# Patient Record
Sex: Male | Born: 1950
Health system: Southern US, Community
[De-identification: ages and names within clinical notes are randomized; demographics above are authoritative.]

## PROBLEM LIST (undated history)

## (undated) ENCOUNTER — Emergency Department: Discharge status: Absent without leave | Payer: BLUE CROSS/BLUE SHIELD

## (undated) DIAGNOSIS — E785 Hyperlipidemia, unspecified: Secondary | ICD-10-CM

## (undated) DIAGNOSIS — H269 Unspecified cataract: Secondary | ICD-10-CM

## (undated) DIAGNOSIS — R059 Cough, unspecified: Secondary | ICD-10-CM

## (undated) DIAGNOSIS — R21 Rash and other nonspecific skin eruption: Secondary | ICD-10-CM

## (undated) DIAGNOSIS — R05 Cough: Secondary | ICD-10-CM

## (undated) DIAGNOSIS — I10 Essential (primary) hypertension: Secondary | ICD-10-CM

## (undated) DIAGNOSIS — J329 Chronic sinusitis, unspecified: Secondary | ICD-10-CM

## (undated) DIAGNOSIS — R0981 Nasal congestion: Secondary | ICD-10-CM

## (undated) HISTORY — DX: Hyperlipidemia, unspecified: E78.5

## (undated) HISTORY — DX: Cough: R05

## (undated) HISTORY — DX: Nasal congestion: R09.81

## (undated) HISTORY — DX: Rash and other nonspecific skin eruption: R21

## (undated) HISTORY — DX: Chronic sinusitis, unspecified: J32.9

## (undated) HISTORY — DX: Essential (primary) hypertension: I10

## (undated) HISTORY — DX: Cough, unspecified: R05.9

## (undated) HISTORY — DX: Unspecified cataract: H26.9

---

## 1995-10-28 HISTORY — PX: INGUINAL HERNIA REPAIR: SHX194

## 2001-11-12 ENCOUNTER — Observation Stay (HOSPITAL_COMMUNITY): Admission: EM | Admit: 2001-11-12 | Discharge: 2001-11-13 | Payer: Self-pay | Admitting: *Deleted

## 2001-11-12 ENCOUNTER — Encounter: Payer: Self-pay | Admitting: *Deleted

## 2004-10-27 HISTORY — PX: COLONOSCOPY: SHX174

## 2011-03-25 ENCOUNTER — Encounter: Payer: Self-pay | Admitting: Physician Assistant

## 2013-03-09 ENCOUNTER — Other Ambulatory Visit: Payer: Self-pay | Admitting: Physician Assistant

## 2013-06-12 ENCOUNTER — Other Ambulatory Visit: Payer: Self-pay | Admitting: Family Medicine

## 2013-06-14 ENCOUNTER — Other Ambulatory Visit: Payer: Self-pay | Admitting: Family Medicine

## 2013-06-14 NOTE — Telephone Encounter (Signed)
Last seen 2/14 [facility]   

## 2013-06-14 NOTE — Telephone Encounter (Signed)
Last seen 12/15/12  [facility]

## 2013-06-20 ENCOUNTER — Encounter: Payer: Self-pay | Admitting: Family Medicine

## 2013-06-20 ENCOUNTER — Telehealth: Payer: Self-pay | Admitting: Family Medicine

## 2013-06-20 ENCOUNTER — Ambulatory Visit (INDEPENDENT_AMBULATORY_CARE_PROVIDER_SITE_OTHER): Payer: BC Managed Care – PPO | Admitting: Family Medicine

## 2013-06-20 VITALS — BP 149/89 | HR 76 | Temp 97.6°F | Ht 74.0 in | Wt 265.8 lb

## 2013-06-20 DIAGNOSIS — IMO0002 Reserved for concepts with insufficient information to code with codable children: Secondary | ICD-10-CM

## 2013-06-20 DIAGNOSIS — L02413 Cutaneous abscess of right upper limb: Secondary | ICD-10-CM

## 2013-06-20 MED ORDER — SULFAMETHOXAZOLE-TRIMETHOPRIM 800-160 MG PO TABS: 1.0000 | ORAL_TABLET | Freq: Two times a day (BID) | ORAL | Status: DC

## 2013-06-20 NOTE — Progress Notes (Signed)
Patient ID: James Solis, male   DOB: [birth year], 62 y.o.   MRN: [id number] SUBJECTIVE: CC: Chief Complaint  Patient presents with  . Acute Visit    pustule rt elbow area     HPI: Had a small pimple on the back of the right forearm.distal to the olecranon bursa. The area then quickly got larger and has a pustule wit pus. Thinks it is MRSA No h/o DM. Past Medical History  Diagnosis Date  . Hypertension   . Gout   . Rash     bilateral legs   . Sinusitis   . Nasal congestion   . Cough   . Hyperlipidemia    Past Surgical History  Procedure Laterality Date  . Inguinal hernia repair  1997   History   Social History  . Marital Status: Married    Spouse Name: N/A    Number of Children: N/A  . Years of Education: N/A   Occupational History  . Not on file.   Social History Main Topics  . Smoking status: Never Smoker   . Smokeless tobacco: Not on file  . Alcohol Use: Not on file  . Drug Use: Not on file  . Sexual Activity: Not on file   Other Topics Concern  . Not on file   Social History Narrative  . No narrative on file   Family History  Problem Relation Age of Onset  . Asthma Mother   . Coronary artery disease Father     x3 Bypasses   . Hypertension     Current Outpatient Prescriptions on File Prior to Visit  Medication Sig Dispense Refill  . allopurinol (ZYLOPRIM) 300 MG tablet TAKE ONE TABLET BY MOUTH ONE TIME DAILY  30 tablet  0  . amLODipine (NORVASC) 10 MG tablet TAKE ONE TABLET BY MOUTH ONE TIME DAILY  30 tablet  0  . aspirin (LONGS ADULT LOW STRENGTH ASA) 81 MG EC tablet Take 81 mg by mouth daily.        . clotrimazole-betamethasone (LOTRISONE) cream Apply topically 2 (two) times daily.        Marland Kitchen lisinopril-hydrochlorothiazide (PRINZIDE,ZESTORETIC) 20-25 MG per tablet TAKE ONE TABLET BY MOUTH ONE TIME DAILY  30 tablet  0  . terazosin (HYTRIN) 10 MG capsule Take 10 mg by mouth 2 (two) times daily.         No current facility-administered medications on  file prior to visit.   No Known Allergies  There is no immunization history on file for this patient. Prior to Admission medications   Medication Sig Start Date End Date Taking? Authorizing Provider  allopurinol (ZYLOPRIM) 300 MG tablet TAKE ONE TABLET BY MOUTH ONE TIME DAILY 06/14/13   Ernestina Penna, MD  amLODipine (NORVASC) 10 MG tablet TAKE ONE TABLET BY MOUTH ONE TIME DAILY 06/14/13   Ernestina Penna, MD  aspirin (LONGS ADULT LOW STRENGTH ASA) 81 MG EC tablet Take 81 mg by mouth daily.      Historical Provider, MD  clotrimazole-betamethasone (LOTRISONE) cream Apply topically 2 (two) times daily.      Historical Provider, MD  lisinopril-hydrochlorothiazide (PRINZIDE,ZESTORETIC) 20-25 MG per tablet TAKE ONE TABLET BY MOUTH ONE TIME DAILY 06/12/13   Ernestina Penna, MD  sulfamethoxazole-trimethoprim (BACTRIM DS,SEPTRA DS) 800-160 MG per tablet Take 1 tablet by mouth 2 (two) times daily. 06/20/13   Ileana Ladd, MD  terazosin (HYTRIN) 10 MG capsule Take 10 mg by mouth 2 (two) times daily.      Historical  Provider, MD     ROS: As above in the HPI. All other systems are stable or negative.  OBJECTIVE: APPEARANCE:  Patient in no acute distress.The patient appeared well nourished and normally developed. Acyanotic. Waist: VITAL SIGNS:BP 149/89  Pulse 76  Temp(Src) 97.6 F (36.4 C) (Oral)  Ht 6\' 2"  (1.88 m)  Wt 265 lb 12.8 oz (120.566 kg)  BMI 34.11 kg/m2  WM  SKIN: warm and  Dry . Carbuncle dordal right forearm pointing and easily bursts to give a drop of pus. Diameter is 1.5 inches with a deep fluctuance.  HEAD and Neck: without JVD, Head and scalp: normal Eyes:No scleral icterus. Fundi normal, eye movements normal. Ears: Auricle normal, canal normal, Tympanic membranes normal, insufflation normal. Nose: normal Throat: normal Neck & thyroid: normal  CHEST & LUNGS: Chest wall: normal Lungs: Clear  CVS: Reveals the PMI to be normally located. Regular rhythm, First and Second  Heart sounds are normal,  absence of murmurs, rubs or gallops. Peripheral vasculature: Radial pulses: normal Dorsal pedis pulses: normal Posterior pulses: normal  Incision and Drainage Procedure Note  Pre-operative Diagnosis: abscess right dorsal forearm  Post-operative Diagnosis: same  Indications: Incision and draiage  Anesthesia:ethyl chloride  Procedure Details  The procedure, risks and complications have been discussed in detail (including, but not limited to airway compromise, infection, bleeding) with the patient, and the patient has signed consent to the procedure.  The skin was sterilely prepped over the affected area in the usual fashion. After adequate local anesthesia with ethyl chloride, I&D with a #11 blade was performed on the center of the abscess Purulent drainage:expressed. Loculations broken wound packed with 2 inches of packing The patient was observed until stable. Patient tolerated the procedure well. Dressing and coban applied.  ASSESSMENT: Abscess of arm, right - Plan: sulfamethoxazole-trimethoprim (BACTRIM DS,SEPTRA DS) 800-160 MG per tablet, Aerobic culture   PLAN: Orders Placed This Encounter  Procedures  . Aerobic culture  daily dressing change.  Meds ordered this encounter  Medications  . sulfamethoxazole-trimethoprim (BACTRIM DS,SEPTRA DS) 800-160 MG per tablet    Sig: Take 1 tablet by mouth 2 (two) times daily.    Dispense:  20 tablet    Refill:  0    Return in about 3 days (around 06/23/2013).  Jandi Swiger P. [place]esto Charon, M.D.

## 2013-06-20 NOTE — Telephone Encounter (Signed)
Painful pustule on right elbow x 1 week.  Denies any drainage.   Appt scheduled for this afternoon.  Patient aware.

## 2013-06-22 ENCOUNTER — Encounter: Payer: Self-pay | Admitting: Family Medicine

## 2013-06-22 ENCOUNTER — Ambulatory Visit (INDEPENDENT_AMBULATORY_CARE_PROVIDER_SITE_OTHER): Payer: BC Managed Care – PPO | Admitting: Family Medicine

## 2013-06-22 VITALS — BP 139/88 | HR 74 | Temp 98.5°F | Wt 264.4 lb

## 2013-06-22 DIAGNOSIS — IMO0002 Reserved for concepts with insufficient information to code with codable children: Secondary | ICD-10-CM | POA: Insufficient documentation

## 2013-06-22 LAB — AEROBIC CULTURE

## 2013-06-22 NOTE — Progress Notes (Signed)
Patient ID: Edric Fetterman, male   DOB: 14-Feb-1951, 62 y.o.   MRN: [id number]. SUBJECTIVE: CC: Chief Complaint  Patient presents with  . Acute Visit    remove paciking     HPI: Was sore yesterday but much better today.  Past Medical History  Diagnosis Date  . Hypertension   . Gout   . Rash     bilateral legs   . Sinusitis   . Nasal congestion   . Cough   . Hyperlipidemia    Past Surgical History  Procedure Laterality Date  . Inguinal hernia repair  1997   Current Outpatient Prescriptions on File Prior to Visit  Medication Sig Dispense Refill  . allopurinol (ZYLOPRIM) 300 MG tablet TAKE ONE TABLET BY MOUTH ONE TIME DAILY  30 tablet  0  . amLODipine (NORVASC) 10 MG tablet TAKE ONE TABLET BY MOUTH ONE TIME DAILY  30 tablet  0  . aspirin (LONGS ADULT LOW STRENGTH ASA) 81 MG EC tablet Take 81 mg by mouth daily.        . clotrimazole-betamethasone (LOTRISONE) cream Apply topically 2 (two) times daily.        Marland Kitchen lisinopril-hydrochlorothiazide (PRINZIDE,ZESTORETIC) 20-25 MG per tablet TAKE ONE TABLET BY MOUTH ONE TIME DAILY  30 tablet  0  . sulfamethoxazole-trimethoprim (BACTRIM DS,SEPTRA DS) 800-160 MG per tablet Take 1 tablet by mouth 2 (two) times daily.  20 tablet  0  . terazosin (HYTRIN) 10 MG capsule Take 10 mg by mouth 2 (two) times daily.         No current facility-administered medications on file prior to visit.   No Known Allergies  There is no immunization history on file for this patient. History   Social History  . Marital Status: Married    Spouse Name: N/A    Number of Children: N/A  . Years of Education: N/A   Occupational History  . Not on file.   Social History Main Topics  . Smoking status: Never Smoker   . Smokeless tobacco: Not on file  . Alcohol Use: Not on file  . Drug Use: Not on file  . Sexual Activity: Not on file   Other Topics Concern  . Not on file   Social History Narrative  . No narrative on file      ROS: As above in the  HPI. All other systems are stable or negative.  OBJECTIVE: APPEARANCE:  Patient in no acute distress.The patient appeared well nourished and normally developed. Acyanotic. Waist: VITAL SIGNS:BP 139/88  Pulse 74  Temp(Src) 98.5 F (36.9 C) (Oral)  Wt 264 lb 6.4 oz (119.931 kg)  BMI 33.93 kg/m2 WM  SKIN: warm and  Dry without overt rashes, tattoos and scars EXTREMETIES: nonedematous. Right arm just proximal to the elbow. The wound drained a blob of purulence which was expressed and removed.the packing was shoertened by 1 inch and a pressure dressing reapplied. The surrounding area of induration is sofyer and the cellulitis is resolving.  MUSCULOSKELETAL:  Spine: normal Joints: intact  NEUROLOGIC: oriented to time,place and person; nonfocal.  Results for orders placed in visit on 06/20/13  AEROBIC CULTURE      Result Value Range   Aerobic Bacterial Culture Final report (*)    Result 1 Staphylococcus aureus (*)    ANTIMICROBIAL SUSCEPTIBILITY Comment      ASSESSMENT: Cellulitis and abscess of upper arm and forearm - improved   PLAN: Continue dressing change. Continue bactrim which sensitivities were positive for. RTC in 2  days for packing remove irrigation and determination if repacking is needed.  Kainalu Heggs P. [place] Charon, M.D.

## 2013-06-23 ENCOUNTER — Ambulatory Visit: Payer: BC Managed Care – PPO | Admitting: Family Medicine

## 2013-06-24 ENCOUNTER — Encounter: Payer: Self-pay | Admitting: Family Medicine

## 2013-06-24 ENCOUNTER — Ambulatory Visit (INDEPENDENT_AMBULATORY_CARE_PROVIDER_SITE_OTHER): Payer: BC Managed Care – PPO | Admitting: Family Medicine

## 2013-06-24 DIAGNOSIS — IMO0002 Reserved for concepts with insufficient information to code with codable children: Secondary | ICD-10-CM

## 2013-06-24 NOTE — Progress Notes (Signed)
  Subjective:    Patient ID: James Ben., male    DOB: 1951/01/08, 62 y.o.   MRN: [id number]  HPI  This 62 y.o. male presents for evaluation of right elbow wound.  He underwent An incision and drainage and is here for wound check and to have the wound  repacked.  Review of Systems C/o right elbow wound   No chest pain, SOB, HA, dizziness, vision change, N/V, diarrhea, constipation, dysuria, urinary urgency or frequency, myalgias, arthralgias or rash.  Objective:   Physical Exam Vital signs noted  Well developed well nourished male.  HEENT - Head atraumatic Normocephalic                Eyes - PERRLA, Conjuctiva - clear Sclera- Clear EOMI                Throat - oropharanx wnl Respiratory - Lungs CTA bilateral Cardiac - RRR S1 and S2 without murmur Skin - Right elbow with circular wound and erythematous border.      Assessment & Plan:  Cellulitis and abscess of upper arm and forearm Continue bactrim DS and repack wound and follow up next week for follow up.

## 2013-06-24 NOTE — Patient Instructions (Addendum)
Wound Care Wound care helps prevent pain and infection.  You may need a tetanus shot if:  You cannot remember when you had your last tetanus shot.  You have never had a tetanus shot.  The injury broke your skin. If you need a tetanus shot and you choose not to have one, you may get tetanus. Sickness from tetanus can be serious. HOME CARE   Only take medicine as told by your doctor.  Clean the wound daily with mild soap and water.  Change any bandages (dressings) as told by your doctor.  Put medicated cream and a bandage on the wound as told by your doctor.  Change the bandage if it gets wet, dirty, or starts to smell.  Take showers. Do not take baths, swim, or do anything that puts your wound under water.  Rest and raise (elevate) the wound until the pain and puffiness (swelling) are better.  Keep all doctor visits as told. GET HELP RIGHT AWAY IF:   Yellowish-white fluid (pus) comes from the wound.  Medicine does not lessen your pain.  There is a red streak going away from the wound.  You have a fever. MAKE SURE YOU:   Understand these instructions.  Will watch your condition.  Will get help right away if you are not doing well or get worse. Document Released: 07/22/2008 Document Revised: 01/05/2012 Document Reviewed: 02/16/2011 [employer] Patient Information 2014 [employer].  

## 2013-06-28 ENCOUNTER — Encounter: Payer: Self-pay | Admitting: Family Medicine

## 2013-06-28 ENCOUNTER — Ambulatory Visit (INDEPENDENT_AMBULATORY_CARE_PROVIDER_SITE_OTHER): Payer: BC Managed Care – PPO | Admitting: Family Medicine

## 2013-06-28 VITALS — BP 142/76 | HR 77 | Temp 97.3°F | Wt 265.2 lb

## 2013-06-28 DIAGNOSIS — IMO0002 Reserved for concepts with insufficient information to code with codable children: Secondary | ICD-10-CM

## 2013-06-28 NOTE — Progress Notes (Signed)
Patient ID: James Morici., male   DOB: 12-21-1950, 62 y.o.   MRN: [id number] SUBJECTIVE: CC: Chief Complaint  Patient presents with  . Follow-up    reck rt elbow    HPI: Here for wound check. The packing was removed 4 days ago and he has been cleaning it. It is healing well. Just a few days of antibiotics left.  ROS: As above in the HPI. All other systems are stable or negative.  OBJECTIVE: APPEARANCE:  Patient in no acute distress.The patient appeared well nourished and normally developed. Acyanotic. Waist: VITAL SIGNS:BP 142/76  Pulse 77  Temp(Src) 97.3 F (36.3 C) (Oral)  Wt 265 lb 3.2 oz (120.294 kg)  BMI 34.04 kg/m2 WM  MUSCULOSKELETAL:  Spine: normal Joints: intact  NEUROLOGIC: oriented to time,place and person; nonfocal. Strength is normal Sensory is normal Reflexes are normal Cranial Nerves are normal.  ASSESSMENT: Cellulitis and abscess of upper arm and forearm - resolved with residual granulating area/wound.  PLAN: Continue wound care until totally healed over.  Return if symptoms worsen or fail to improve.  Monique Gift P. [place] Charon, M.D.

## 2013-07-16 ENCOUNTER — Other Ambulatory Visit: Payer: Self-pay | Admitting: Family Medicine

## 2013-07-18 ENCOUNTER — Other Ambulatory Visit: Payer: Self-pay

## 2013-07-18 MED ORDER — TERAZOSIN HCL 10 MG PO CAPS: 10.0000 mg | ORAL_CAPSULE | Freq: Two times a day (BID) | ORAL | Status: DC

## 2013-09-01 ENCOUNTER — Other Ambulatory Visit: Payer: Self-pay

## 2013-09-05 ENCOUNTER — Other Ambulatory Visit: Payer: Self-pay

## 2013-09-05 MED ORDER — CLOTRIMAZOLE-BETAMETHASONE 1-0.05 % EX CREA: TOPICAL_CREAM | Freq: Two times a day (BID) | CUTANEOUS | Status: DC

## 2013-12-15 ENCOUNTER — Encounter: Payer: Self-pay | Admitting: Family Medicine

## 2013-12-15 ENCOUNTER — Ambulatory Visit (INDEPENDENT_AMBULATORY_CARE_PROVIDER_SITE_OTHER): Payer: BC Managed Care – PPO | Admitting: Family Medicine

## 2013-12-15 VITALS — BP 157/90 | HR 63 | Temp 97.1°F | Ht 73.0 in | Wt 264.8 lb

## 2013-12-15 DIAGNOSIS — Z125 Encounter for screening for malignant neoplasm of prostate: Secondary | ICD-10-CM

## 2013-12-15 DIAGNOSIS — I10 Essential (primary) hypertension: Secondary | ICD-10-CM | POA: Insufficient documentation

## 2013-12-15 DIAGNOSIS — Z0000 Encounter for general adult medical examination without abnormal findings: Secondary | ICD-10-CM

## 2013-12-15 DIAGNOSIS — E785 Hyperlipidemia, unspecified: Secondary | ICD-10-CM | POA: Insufficient documentation

## 2013-12-15 DIAGNOSIS — M109 Gout, unspecified: Secondary | ICD-10-CM | POA: Insufficient documentation

## 2013-12-15 DIAGNOSIS — Z119 Encounter for screening for infectious and parasitic diseases, unspecified: Secondary | ICD-10-CM

## 2013-12-15 MED ORDER — TERAZOSIN HCL 10 MG PO CAPS: 10.0000 mg | ORAL_CAPSULE | Freq: Every day | ORAL | Status: DC

## 2013-12-15 MED ORDER — LISINOPRIL 40 MG PO TABS: 40.0000 mg | ORAL_TABLET | Freq: Every day | ORAL | Status: DC

## 2013-12-15 MED ORDER — ALLOPURINOL 300 MG PO TABS: ORAL_TABLET | ORAL | Status: DC

## 2013-12-15 MED ORDER — AMLODIPINE BESYLATE 10 MG PO TABS: ORAL_TABLET | ORAL | Status: DC

## 2013-12-15 NOTE — Progress Notes (Signed)
Patient ID: Aram Domzalski., male   DOB: 1951/03/30, 63 y.o.   MRN: [id number] SUBJECTIVE: CC: Chief Complaint  Patient presents with  . Annual Exam    cpx needs  for anthem inusrance   . Medication Refill    WANTS WRITTEN RX    HPI: 1) here for a annula physical. He did have a colonoscopy and is up to date on that. Documented in the paper chart. Was negative. Next colonoscopy de next year.  2) Patient is here for follow up of hyperlipidemia/HTN/gout/obesity: denies Headache;denies Chest Pain;denies weakness;denies Shortness of Breath and orthopnea;denies Visual changes;denies palpitations;denies cough;denies pedal edema;denies symptoms of TIA or stroke;deniesClaudication symptoms. admits to Compliance with medications; denies Problems with medications.   Past Medical History  Diagnosis Date  . Gout   . Rash     bilateral legs   . Sinusitis   . Nasal congestion   . Cough   . Hyperlipidemia   . Hypertension    Past Surgical History  Procedure Laterality Date  . Inguinal hernia repair  1997   History   Social History  . Marital Status: Married    Spouse Name: N/A    Number of Children: N/A  . Years of Education: N/A   Occupational History  . Not on file.   Social History Main Topics  . Smoking status: Never Smoker   . Smokeless tobacco: Not on file  . Alcohol Use: Not on file  . Drug Use: Not on file  . Sexual Activity: Not on file   Other Topics Concern  . Not on file   Social History Narrative  . No narrative on file   Family History  Problem Relation Age of Onset  . Asthma Mother   . Coronary artery disease Father     x3 Bypasses   . Hypertension     Current Outpatient Prescriptions on File Prior to Visit  Medication Sig Dispense Refill  . aspirin (LONGS ADULT LOW STRENGTH ASA) 81 MG EC tablet Take 81 mg by mouth daily.       . clotrimazole-betamethasone (LOTRISONE) cream Apply topically 2 (two) times daily.  30 g  2   No current  facility-administered medications on file prior to visit.   No Known Allergies Immunization History  Administered Date(s) Administered  . Influenza-Unspecified 12/15/2013  . Tdap 10/27/2004   Prior to Admission medications   Medication Sig Start Date End Date Taking? Authorizing Provider  allopurinol (ZYLOPRIM) 300 MG tablet TAKE ONE TABLET BY MOUTH ONE TIME DAILY 07/16/13  Yes Vernie Shanks, MD  amLODipine (NORVASC) 10 MG tablet TAKE ONE TABLET BY MOUTH ONE  TIME DAILY 07/16/13  Yes Vernie Shanks, MD  aspirin (LONGS ADULT LOW STRENGTH ASA) 81 MG EC tablet Take 81 mg by mouth daily.     Yes Historical Provider, MD  clotrimazole-betamethasone (LOTRISONE) cream Apply topically 2 (two) times daily. 09/05/13  Yes Vernie Shanks, MD  lisinopril-hydrochlorothiazide (PRINZIDE,ZESTORETIC) 20-25 MG per tablet TAKE ONE TABLET BY MOUTH ONE TIME DAILY 07/16/13  Yes Vernie Shanks, MD  terazosin (HYTRIN) 10 MG capsule Take 1 capsule (10 mg total) by mouth 2 (two) times daily. 07/18/13  Yes Chipper Herb, MD     ROS: As above in the HPI. All other systems are stable or negative.  OBJECTIVE: APPEARANCE:  Patient in no acute distress.The patient appeared well nourished and normally developed. Acyanotic. Waist: VITAL SIGNS:BP 157/90  Pulse 63  Temp(Src) 97.1 F (36.2 C) (Oral)  Ht 6' 1"  (1.854 m)  Wt 264 lb 12.8 oz (120.112 kg)  BMI 34.94 kg/m2  Obese WM  SKIN: warm and  Dry without overt rashes, tattoos and scars  HEAD and Neck: without JVD, Head and scalp: normal Eyes:No scleral icterus. Fundi normal, eye movements normal. Ears: Auricle normal, canal normal, Tympanic membranes normal, insufflation normal. Nose: normal Throat: normal Neck & thyroid: normal  CHEST & LUNGS: Chest wall: normal Lungs: Clear  CVS: Reveals the PMI to be normally located. Regular rhythm, First and Second Heart sounds are normal,  absence of murmurs, rubs or gallops. Peripheral vasculature: Radial  pulses: normal Dorsal pedis pulses: normal Posterior pulses: normal  ABDOMEN:  Appearance: Obese Benign, no organomegaly, no masses, no Abdominal Aortic enlargement. No Guarding , no rebound. No Bruits. Bowel sounds: normal  RECTAL: skin tag. Heme negative brown stool. Prostate normal size for age. GU: Normal. No hernia.. No lymphadenopathy  EXTREMETIES: nonedematous.  MUSCULOSKELETAL:  Spine: normal Joints: intact  NEUROLOGIC: oriented to time,place and person; nonfocal. Strength is normal Sensory is normal Reflexes are normal Cranial Nerves are normal.  ASSESSMENT: Physical exam - Plan: EKG 12-Lead, EKG 12-Lead, PSA, total and free, Hepatitis C antibody  Gout - Plan: allopurinol (ZYLOPRIM) 300 MG tablet, CMP14+EGFR, Uric acid  Hyperlipidemia - Plan: CMP14+EGFR, Lipid panel  Hypertension - Plan: terazosin (HYTRIN) 10 MG capsule, amLODipine (NORVASC) 10 MG tablet, lisinopril (PRINIVIL,ZESTRIL) 40 MG tablet, CMP14+EGFR  Screening examination for infectious disease - Plan: Hepatitis C antibody  Screening for prostate cancer - Plan: PSA, total and free  PLAN:       HEALTH MAINTENANCE Immunizations: Tetanus-Diphtheria Booster due:2016 Pertusis Booster due:2016 Flu Shot Due: every Fall Pneumonia Vaccine: usually at 61 years of age unless there are certain risk situations. Herpes Zoster/Shingles Vaccine due: usually at 61 years of age HPV XHB:ZJIR age 85 to 70 years in males and females.  Healthy Life Habits: Exercise Goal: 5-6 days/week; start gradually(ie 30 minutes/3days per week) Nutrition: Balanced healthy meals including Vegetables and Fruits. Consider  Reading the following books: 1) Eat to Live by Dr Diona Browner; 2) Prevent and Reverse Heart Disease by Dr Karl Luke.  Vitamins:a multivitamin is okay if taken Aspirin:81 mg daily Stop Tobacco Use:n/a Seat Belt Use:+++ recommended Sunscreen Use:+++ recommended  Recommended Screening Tests: Colon  Cancer Screening: due 2016 Blood work: today Cholesterol Screening:    today       HIV:   n/a                 Hepatitis C(people born 1945-1965):today Monthly Self Testicular Exam:+++  Eye Exam: every 1 to 2 years recommended Dental Health: at least every 6 months  Others:    Living Will/Healthcare Power of Attorney: should have this in order with your personal estate planning     Orders Placed This Encounter  Procedures  . CMP14+EGFR  . Lipid panel  . Uric acid  . PSA, total and free  . Hepatitis C antibody  . EKG 12-Lead    Standing Status: Standing     Number of Occurrences: 1     Standing Expiration Date:     Order Specific Question:  Reason for Exam    Answer:  PHYSICAL   EKG : was normal. Will eliminate the diuretic to reduce the hyperuricemia. Adjust BP medications to try to attain goal. Nutrition recommendations.  Meds ordered this encounter  Medications  . terazosin (HYTRIN) 10 MG capsule    Sig: Take 1 capsule (10 mg  total) by mouth at bedtime.    Dispense:  90 capsule    Refill:  3  . amLODipine (NORVASC) 10 MG tablet    Sig: TAKE ONE TABLET BY MOUTH ONE  TIME DAILY    Dispense:  90 tablet    Refill:  3  . allopurinol (ZYLOPRIM) 300 MG tablet    Sig: TAKE ONE TABLET BY MOUTH ONE TIME DAILY    Dispense:  90 tablet    Refill:  3  . lisinopril (PRINIVIL,ZESTRIL) 40 MG tablet    Sig: Take 1 tablet (40 mg total) by mouth daily.    Dispense:  90 tablet    Refill:  3   Medications Discontinued During This Encounter  Medication Reason  . sulfamethoxazole-trimethoprim (BACTRIM DS,SEPTRA DS) 800-160 MG per tablet Completed Course  . lisinopril-hydrochlorothiazide (PRINZIDE,ZESTORETIC) 20-25 MG per tablet Dose change  . terazosin (HYTRIN) 10 MG capsule Reorder  . amLODipine (NORVASC) 10 MG tablet Reorder  . allopurinol (ZYLOPRIM) 300 MG tablet Reorder   Return in about 6 weeks (around 01/26/2014) for recheck BP.  Darshan Solanki P. Jacelyn Grip, M.D.

## 2013-12-15 NOTE — Patient Instructions (Addendum)
HEALTH MAINTENANCE Immunizations: Tetanus-Diphtheria Booster due:2016 Pertusis Booster due:2016 Flu Shot Due: every Fall Pneumonia Vaccine: usually at 61 years of age unless there are certain risk situations. Herpes Zoster/Shingles Vaccine due: usually at 61 years of age HPV [id number]:from age 299 to 6326 years in males and females.  Healthy Life Habits: Exercise Goal: 5-6 days/week; start gradually(ie 30 minutes/3days per week) Nutrition: Balanced healthy meals including Vegetables and Fruits. Consider  Reading the following books: 1) Eat to Live by Dr Ottis StainJoel Furhman; 2) Prevent and Reverse Heart Disease by Dr Suzzette Righteraldwell Esselstyn.  Vitamins:a multivitamin is okay if taken Aspirin:81 mg daily Stop Tobacco Use:n/a Seat Belt Use:+++ recommended Sunscreen Use:+++ recommended  Recommended Screening Tests: Colon Cancer Screening: due 2016 Blood work: today Cholesterol Screening:    today       HIV:   n/a                 Hepatitis C(people born 1945-1965):today Monthly Self Testicular Exam:+++  Eye Exam: every 1 to 2 years recommended Dental Health: at least every 6 months  Others:    Living Will/Healthcare Power of Attorney: should have this in order with your personal estate planning

## 2013-12-16 LAB — LIPID PANEL
Chol/HDL Ratio: 4.4 ratio units (ref 0.0–5.0)
Cholesterol, Total: 174 mg/dL (ref 100–199)
HDL: 40 mg/dL (ref >39)
LDL Calculated: 107 mg/dL — ABNORMAL HIGH (ref 0–99)
Triglycerides: 137 mg/dL (ref 0–149)
VLDL Cholesterol Cal: 27 mg/dL (ref 5–40)

## 2013-12-16 LAB — PSA, TOTAL AND FREE
PSA, Free Pct: 48.8 %
PSA, Free: 0.78 ng/mL
PSA: 1.6 ng/mL (ref 0.0–4.0)

## 2013-12-16 LAB — CMP14+EGFR
ALT: 30 IU/L (ref 0–44)
AST: 21 IU/L (ref 0–40)
Albumin/Globulin Ratio: 2.1 (ref 1.1–2.5)
Albumin: 4.7 g/dL (ref 3.6–4.8)
Alkaline Phosphatase: 56 IU/L (ref 39–117)
BUN/Creatinine Ratio: 23 — ABNORMAL HIGH (ref 10–22)
BUN: 23 mg/dL (ref 8–27)
CO2: 27 mmol/L (ref 18–29)
Calcium: 9.8 mg/dL (ref 8.6–10.2)
Chloride: 102 mmol/L (ref 97–108)
Creatinine, Ser: 1 mg/dL (ref 0.76–1.27)
GFR calc Af Amer: 94 mL/min/{1.73_m2} (ref >59)
GFR calc non Af Amer: 81 mL/min/{1.73_m2} (ref >59)
Globulin, Total: 2.2 g/dL (ref 1.5–4.5)
Glucose: 93 mg/dL (ref 65–99)
Potassium: 3.7 mmol/L (ref 3.5–5.2)
Sodium: 143 mmol/L (ref 134–144)
Total Bilirubin: 0.6 mg/dL (ref 0.0–1.2)
Total Protein: 6.9 g/dL (ref 6.0–8.5)

## 2013-12-16 LAB — HEPATITIS C ANTIBODY: Hep C Virus Ab: <0.1 s/co ratio (ref 0.0–0.9)

## 2013-12-16 LAB — URIC ACID: Uric Acid: 5.5 mg/dL (ref 3.7–8.6)

## 2013-12-30 ENCOUNTER — Encounter: Payer: Self-pay | Admitting: Family Medicine

## 2013-12-30 ENCOUNTER — Ambulatory Visit (INDEPENDENT_AMBULATORY_CARE_PROVIDER_SITE_OTHER): Payer: BC Managed Care – PPO | Admitting: Family Medicine

## 2013-12-30 VITALS — BP 168/91 | HR 76 | Temp 101.0°F | Ht 73.0 in | Wt 264.0 lb

## 2013-12-30 DIAGNOSIS — R05 Cough: Secondary | ICD-10-CM

## 2013-12-30 DIAGNOSIS — R059 Cough, unspecified: Secondary | ICD-10-CM

## 2013-12-30 DIAGNOSIS — R52 Pain, unspecified: Secondary | ICD-10-CM

## 2013-12-30 DIAGNOSIS — J111 Influenza due to unidentified influenza virus with other respiratory manifestations: Secondary | ICD-10-CM

## 2013-12-30 DIAGNOSIS — R509 Fever, unspecified: Secondary | ICD-10-CM

## 2013-12-30 LAB — POCT INFLUENZA A/B
Influenza A, POC: POSITIVE
Influenza B, POC: NEGATIVE

## 2013-12-30 MED ORDER — OSELTAMIVIR PHOSPHATE 75 MG PO CAPS: 75.0000 mg | ORAL_CAPSULE | Freq: Two times a day (BID) | ORAL | Status: DC

## 2013-12-30 MED ORDER — AZITHROMYCIN 250 MG PO TABS: ORAL_TABLET | ORAL | Status: DC

## 2013-12-30 NOTE — Progress Notes (Signed)
   Subjective:    Patient ID: James BenAlvin Gammell Jr., male    DOB: 06/18/1951, 63 y.o.   MRN: [id number]  HPI  This 63 y.o. male presents for evaluation of aches, pain, fever, URI sx's for the last day.  Review of Systems    No chest pain, SOB, HA, dizziness, vision change, N/V, diarrhea, constipation, dysuria, urinary urgency or frequency, myalgias, arthralgias or rash.  Objective:   Physical Exam Vital signs noted  Well developed well nourished male.  HEENT - Head atraumatic Normocephalic                Eyes - PERRLA, Conjuctiva - clear Sclera- Clear EOMI                Ears - EAC's Wnl TM's Wnl Gross Hearing WNL                Nose - Nares patent                 Throat - oropharanx wnl Respiratory - Lungs CTA bilateral Cardiac - RRR S1 and S2 without murmur GI - Abdomen soft Nontender and bowel sounds active x 4 Extremities - No edema. Neuro - Grossly intact.       Assessment & Plan:  Fever - Plan: POCT Influenza A/B  Body aches - Plan: POCT Influenza A/B  Cough - Plan: POCT Influenza A/B  Flu - Plan: azithromycin (ZITHROMAX) 250 MG tablet, oseltamivir (TAMIFLU) 75 MG capsule  Push po fluids, rest, tylenol and motrin otc prn as directed for fever, arthralgias, and myalgias.  Follow up prn if sx's continue or persist.  Deatra CanterWilliam J Shaneisha Burkel FNP

## 2014-01-26 ENCOUNTER — Ambulatory Visit (INDEPENDENT_AMBULATORY_CARE_PROVIDER_SITE_OTHER): Payer: BC Managed Care – PPO | Admitting: Family Medicine

## 2014-01-26 ENCOUNTER — Encounter: Payer: Self-pay | Admitting: Family Medicine

## 2014-01-26 VITALS — BP 142/85 | HR 62 | Temp 97.9°F | Ht 73.0 in | Wt 269.0 lb

## 2014-01-26 DIAGNOSIS — Z23 Encounter for immunization: Secondary | ICD-10-CM | POA: Insufficient documentation

## 2014-01-26 DIAGNOSIS — E785 Hyperlipidemia, unspecified: Secondary | ICD-10-CM

## 2014-01-26 DIAGNOSIS — I10 Essential (primary) hypertension: Secondary | ICD-10-CM

## 2014-01-26 DIAGNOSIS — M109 Gout, unspecified: Secondary | ICD-10-CM

## 2014-01-26 NOTE — Progress Notes (Signed)
Patient ID: James Kiener., male   DOB: 1951-09-28, 63 y.o.   MRN: [id number] SUBJECTIVE: CC: Chief Complaint  Patient presents with  . Hypertension    recheck    HPI: Patient is here for follow up of hypertension: denies Headache;deniesChest Pain;denies weakness;denies Shortness of Breath or Orthopnea;denies Visual changes;denies palpitations;denies cough;denies pedal edema;denies symptoms of TIA or stroke; admits to Compliance with medications. denies Problems with medications.  Past Medical History  Diagnosis Date  . Gout   . Rash     bilateral legs   . Sinusitis   . Nasal congestion   . Cough   . Hyperlipidemia   . Hypertension    Past Surgical History  Procedure Laterality Date  . Inguinal hernia repair  1997   History   Social History  . Marital Status: Married    Spouse Name: N/A    Number of Children: N/A  . Years of Education: N/A   Occupational History  . Not on file.   Social History Main Topics  . Smoking status: Never Smoker   . Smokeless tobacco: Not on file  . Alcohol Use: Not on file  . Drug Use: Not on file  . Sexual Activity: Not on file   Other Topics Concern  . Not on file   Social History Narrative  . No narrative on file   Family History  Problem Relation Age of Onset  . Asthma Mother   . Coronary artery disease Father     x3 Bypasses   . Hypertension     Current Outpatient Prescriptions on File Prior to Visit  Medication Sig Dispense Refill  . allopurinol (ZYLOPRIM) 300 MG tablet TAKE ONE TABLET BY MOUTH ONE TIME DAILY  90 tablet  3  . amLODipine (NORVASC) 10 MG tablet TAKE ONE TABLET BY MOUTH ONE  TIME DAILY  90 tablet  3  . aspirin (LONGS ADULT LOW STRENGTH ASA) 81 MG EC tablet Take 81 mg by mouth daily.       Marland Kitchen lisinopril (PRINIVIL,ZESTRIL) 40 MG tablet Take 1 tablet (40 mg total) by mouth daily.  90 tablet  3  . terazosin (HYTRIN) 10 MG capsule Take 1 capsule (10 mg total) by mouth at bedtime.  90 capsule  3   No current  facility-administered medications on file prior to visit.   No Known Allergies Immunization History  Administered Date(s) Administered  . Influenza-Unspecified 12/15/2013  . Tdap 10/27/2004  . Zoster 01/26/2014   Prior to Admission medications   Medication Sig Start Date End Date Taking? Authorizing Provider  allopurinol (ZYLOPRIM) 300 MG tablet TAKE ONE TABLET BY MOUTH ONE TIME DAILY 12/15/13  Yes Ileana Ladd, MD  amLODipine (NORVASC) 10 MG tablet TAKE ONE TABLET BY MOUTH ONE  TIME DAILY 12/15/13  Yes Ileana Ladd, MD  aspirin (LONGS ADULT LOW STRENGTH ASA) 81 MG EC tablet Take 81 mg by mouth daily.    Yes Historical Provider, MD  lisinopril (PRINIVIL,ZESTRIL) 40 MG tablet Take 1 tablet (40 mg total) by mouth daily. 12/15/13  Yes Ileana Ladd, MD  terazosin (HYTRIN) 10 MG capsule Take 1 capsule (10 mg total) by mouth at bedtime. 12/15/13  Yes Ileana Ladd, MD     ROS: As above in the HPI. All other systems are stable or negative.  OBJECTIVE: APPEARANCE:  Patient in no acute distress.The patient appeared well nourished and normally developed. Acyanotic. Waist: VITAL SIGNS:BP 142/85  Pulse 62  Temp(Src) 97.9 F (36.6 C) (Oral)  Ht 6\' 1"  (1.854 m)  Wt 269 lb (122.018 kg)  BMI 35.50 kg/m2 Obese WM  SKIN: warm and  Dry without overt rashes, tattoos and scars  HEAD and Neck: without JVD, Head and scalp: normal Eyes:No scleral icterus. Fundi normal, eye movements normal. Ears: Auricle normal, canal normal, Tympanic membranes normal, insufflation normal. Nose: normal Throat: normal Neck & thyroid: normal  CHEST & LUNGS: Chest wall: normal Lungs: Clear  CVS: Reveals the PMI to be normally located. Regular rhythm, First and Second Heart sounds are normal,  absence of murmurs, rubs or gallops. Peripheral vasculature: Radial pulses: normal Dorsal pedis pulses: normal Posterior pulses: normal  ABDOMEN:  Appearance: Obese Benign, no organomegaly, no masses, no  Abdominal Aortic enlargement. No Guarding , no rebound. No Bruits. Bowel sounds: normal  RECTAL: N/A GU: N/A  EXTREMETIES: nonedematous.  MUSCULOSKELETAL:  Spine: normal Joints: intact  NEUROLOGIC: oriented to time,place and person; nonfocal. Strength is normal Sensory is normal Reflexes are normal Cranial Nerves are normal.   ASSESSMENT:  Hypertension  Need for zoster vaccine - Plan: Varicella-zoster vaccine subcutaneous  Hyperlipidemia  Gout BP is better with the change in therapy. The HCTZ was removed from the regimen to avoid worsening gout frequencies. And the ACE inhibitor was increased   PLAN:      Dr Woodroe ModeFrancis Alyana Kreiter's Recommendations  For nutrition information, I recommend books:  1).Eat to Live by Dr Monico HoarJoel Fuhrman. 2).Prevent and Reverse Heart Disease by Dr Suzzette Righteraldwell Esselstyn. 3) Dr Katherina RightNeal Barnard's Book:  Program to Reverse Diabetes  Exercise recommendations are:  If unable to walk, then the patient can exercise in a chair 3 times a day. By flapping arms like a bird gently and raising legs outwards to the front.  If ambulatory, the patient can go for walks for 30 minutes 3 times a week. Then increase the intensity and duration as tolerated.  Goal is to try to attain exercise frequency to 5 times a week.  If applicable: Best to perform resistance exercises (machines or weights) 2 days a week and cardio type exercises 3 days per week.   Patient declined any medication increases or adjustments to reach to goal. He plans to lose weight dietary changes and exercise.  Orders Placed This Encounter  Procedures  . Varicella-zoster vaccine subcutaneous   No orders of the defined types were placed in this encounter.   Medications Discontinued During This Encounter  Medication Reason  . azithromycin (ZITHROMAX) 250 MG tablet Error  . clotrimazole-betamethasone (LOTRISONE) cream   . lisinopril-hydrochlorothiazide (PRINZIDE,ZESTORETIC) 20-25 MG per tablet Change  in therapy  . oseltamivir (TAMIFLU) 75 MG capsule    Return in about 3 months (around 04/27/2014) for recheck BP, Recheck medical problems.  Lilyona Richner P. Modesto CharonWong, M.D.

## 2014-01-26 NOTE — Patient Instructions (Signed)
      Dr Jolicia Delira's Recommendations  For nutrition information, I recommend books:  1).Eat to Live by Dr Joel Fuhrman. 2).Prevent and Reverse Heart Disease by Dr Caldwell Esselstyn. 3) Dr Neal Barnard's Book:  Program to Reverse Diabetes  Exercise recommendations are:  If unable to walk, then the patient can exercise in a chair 3 times a day. By flapping arms like a bird gently and raising legs outwards to the front.  If ambulatory, the patient can go for walks for 30 minutes 3 times a week. Then increase the intensity and duration as tolerated.  Goal is to try to attain exercise frequency to 5 times a week.  If applicable: Best to perform resistance exercises (machines or weights) 2 days a week and cardio type exercises 3 days per week.  

## 2014-08-28 ENCOUNTER — Encounter: Payer: Self-pay | Admitting: Family Medicine

## 2014-08-28 ENCOUNTER — Ambulatory Visit (INDEPENDENT_AMBULATORY_CARE_PROVIDER_SITE_OTHER): Payer: BC Managed Care – PPO | Admitting: Family Medicine

## 2014-08-28 VITALS — BP 142/88 | HR 83 | Temp 97.2°F | Ht 73.0 in | Wt 267.6 lb

## 2014-08-28 DIAGNOSIS — J069 Acute upper respiratory infection, unspecified: Secondary | ICD-10-CM

## 2014-08-28 MED ORDER — AZITHROMYCIN 250 MG PO TABS: ORAL_TABLET | ORAL | Status: DC

## 2014-08-28 MED ORDER — BENZONATATE 100 MG PO CAPS: 100.0000 mg | ORAL_CAPSULE | Freq: Three times a day (TID) | ORAL | Status: DC | PRN

## 2014-08-28 NOTE — Progress Notes (Signed)
   Subjective:    Patient ID: James BenAlvin Jian Jr., male    DOB: [birth year], 63 y.o.   MRN: [id number]  HPI C/o uri sx's and cough  Review of Systems  Constitutional: Negative for fever.  HENT: Negative for ear pain.   Eyes: Negative for discharge.  Respiratory: Negative for cough.   Cardiovascular: Negative for chest pain.  Gastrointestinal: Negative for abdominal distention.  Endocrine: Negative for polyuria.  Genitourinary: Negative for difficulty urinating.  Musculoskeletal: Negative for gait problem and neck pain.  Skin: Negative for color change and rash.  Neurological: Negative for speech difficulty and headaches.  Psychiatric/Behavioral: Negative for agitation.       Objective:    BP 142/88 mmHg  Pulse 83  Temp(Src) 97.2 F (36.2 C) (Oral)  Ht 6\' 1"  (1.854 m)  Wt 267 lb 9.6 oz (121.383 kg)  BMI 35.31 kg/m2 Physical Exam  Constitutional: He is oriented to person, place, and time. He appears well-developed and well-nourished.  HENT:  Head: Normocephalic and atraumatic.  Mouth/Throat: Oropharynx is clear and moist.  Eyes: Pupils are equal, round, and reactive to light.  Neck: Normal range of motion. Neck supple.  Cardiovascular: Normal rate and regular rhythm.   No murmur heard. Pulmonary/Chest: Effort normal and breath sounds normal.  Abdominal: Soft. Bowel sounds are normal. There is no tenderness.  Neurological: He is alert and oriented to person, place, and time.  Skin: Skin is warm and dry.  Psychiatric: He has a normal mood and affect.          Assessment & Plan:     ICD-9-CM ICD-10-CM   1. URI (upper respiratory infection) 465.9 J06.9 azithromycin (ZITHROMAX) 250 MG tablet     benzonatate (TESSALON PERLES) 100 MG capsule  Push po fluids, rest, tylenol and motrin otc prn as directed for fever, arthralgias, and myalgias.  Follow up prn if sx's continue or persist.   Return if symptoms worsen or fail to improve.  Deatra CanterWilliam J Chirstopher Iovino FNP

## 2014-09-19 ENCOUNTER — Telehealth: Payer: Self-pay | Admitting: Family Medicine

## 2014-09-19 DIAGNOSIS — J069 Acute upper respiratory infection, unspecified: Secondary | ICD-10-CM

## 2014-09-19 MED ORDER — AZITHROMYCIN 250 MG PO TABS: ORAL_TABLET | ORAL | Status: DC

## 2014-09-19 NOTE — Telephone Encounter (Signed)
Family aware, antibiotic refill sent in.

## 2014-09-29 ENCOUNTER — Ambulatory Visit (INDEPENDENT_AMBULATORY_CARE_PROVIDER_SITE_OTHER): Payer: BC Managed Care – PPO | Admitting: Physician Assistant

## 2014-09-29 ENCOUNTER — Encounter: Payer: Self-pay | Admitting: Physician Assistant

## 2014-09-29 VITALS — BP 161/86 | HR 71 | Temp 96.8°F | Ht 73.0 in | Wt 272.0 lb

## 2014-09-29 DIAGNOSIS — K219 Gastro-esophageal reflux disease without esophagitis: Secondary | ICD-10-CM

## 2014-09-29 DIAGNOSIS — R05 Cough: Secondary | ICD-10-CM

## 2014-09-29 DIAGNOSIS — R059 Cough, unspecified: Secondary | ICD-10-CM

## 2014-09-29 MED ORDER — OMEPRAZOLE 20 MG PO CPDR: 20.0000 mg | DELAYED_RELEASE_CAPSULE | Freq: Every day | ORAL | Status: DC

## 2014-09-29 MED ORDER — FLUTICASONE PROPIONATE 50 MCG/ACT NA SUSP: 2.0000 | Freq: Every day | NASAL | Status: DC

## 2014-09-29 MED ORDER — CETIRIZINE HCL 10 MG PO TABS: 10.0000 mg | ORAL_TABLET | Freq: Every day | ORAL | Status: DC

## 2014-09-29 NOTE — Progress Notes (Signed)
   Subjective:    Patient ID: James BenAlvin Baldree Jr., male    DOB: 1951-03-04, 63 y.o.   MRN: [id number]  HPI  63 y/o overweight male presents with chronic non productive cough x 3 months. Worse whenn lying down. Has been treated with antibiotics twice with no relief. Associated "trickle in his throat" occasional sneezing and runny nose. Admits that he does not eat healthy, mainly spicy and fatty foods.   Review of Systems  Constitutional: Negative.   HENT: Positive for postnasal drip. Negative for congestion.   Respiratory: Positive for cough (nonproductive). Negative for apnea, choking, chest tightness, shortness of breath, wheezing and stridor.   Cardiovascular: Negative.   Gastrointestinal: Negative.        Objective:   Physical Exam  Constitutional: He appears well-developed and well-nourished. No distress.  HENT:  Head: Normocephalic.  Mouth/Throat: Oropharynx is clear and moist. No oropharyngeal exudate.  Cardiovascular: Normal rate, regular rhythm and normal heart sounds.  Exam reveals no gallop and no friction rub.   No murmur heard. Pulmonary/Chest: Effort normal and breath sounds normal. No respiratory distress. He has no wheezes. He has no rales.  Abdominal: Soft. He exhibits no distension. There is no tenderness.  Skin: He is not diaphoretic.  Vitals reviewed.         Assessment & Plan:  1 cough: No bacterial cause suspected. I feel that this is a combination of allergic rhinitis and GERD so I will treat for both to determine if cough subsides. Printouts were given for each and triggers of GERD were discussed with patient. He will f/u in 3-4 weeks for reassessment. If no improvement with medications, we will look into the possibility of an ACE inhibitor related cough.  Rx: Zyrtec 10 mg PO q day, Flonase as directed.  Omeprazole 20mg  q day

## 2014-09-29 NOTE — Patient Instructions (Signed)
Allergic Rhinitis Allergic rhinitis is when the mucous membranes in the nose respond to allergens. Allergens are particles in the air that cause your body to have an allergic reaction. This causes you to release allergic antibodies. Through a chain of events, these eventually cause you to release histamine into the blood stream. Although meant to protect the body, it is this release of histamine that causes your discomfort, such as frequent sneezing, congestion, and an itchy, runny nose.  CAUSES  Seasonal allergic rhinitis (hay fever) is caused by pollen allergens that may come from grasses, trees, and weeds. Year-round allergic rhinitis (perennial allergic rhinitis) is caused by allergens such as house dust mites, pet dander, and mold spores.  SYMPTOMS   Nasal stuffiness (congestion).  Itchy, runny nose with sneezing and tearing of the eyes. DIAGNOSIS  Your health care provider can help you determine the allergen or allergens that trigger your symptoms. If you and your health care provider are unable to determine the allergen, skin or blood testing may be used. TREATMENT  Allergic rhinitis does not have a cure, but it can be controlled by:  Medicines and allergy shots (immunotherapy).  Avoiding the allergen. Hay fever may often be treated with antihistamines in pill or nasal spray forms. Antihistamines block the effects of histamine. There are over-the-counter medicines that may help with nasal congestion and swelling around the eyes. Check with your health care provider before taking or giving this medicine.  If avoiding the allergen or the medicine prescribed do not work, there are many new medicines your health care provider can prescribe. Stronger medicine may be used if initial measures are ineffective. Desensitizing injections can be used if medicine and avoidance does not work. Desensitization is when a patient is given ongoing shots until the body becomes less sensitive to the allergen.  Make sure you follow up with your health care provider if problems continue. HOME CARE INSTRUCTIONS It is not possible to completely avoid allergens, but you can reduce your symptoms by taking steps to limit your exposure to them. It helps to know exactly what you are allergic to so that you can avoid your specific triggers. SEEK MEDICAL CARE IF:   You have a fever.  You develop a cough that does not stop easily (persistent).  You have shortness of breath.  You start wheezing.  Symptoms interfere with normal daily activities. Document Released: 07/08/2001 Document Revised: 10/18/2013 Document Reviewed: 06/20/2013 [facility] Patient Information 2015 [place], [place]. This information is not intended to replace advice given to you by your health care provider. Make sure you discuss any questions you have with your health care provider. Gastroesophageal Reflux Disease, Adult Gastroesophageal reflux disease (GERD) happens when acid from your stomach goes into your food pipe (esophagus). The acid can cause a burning feeling in your chest. Over time, the acid can make small holes (ulcers) in your food pipe.  HOME CARE  Ask your doctor for advice about:  Losing weight.  Quitting smoking.  Alcohol use.  Avoid foods and drinks that make your problems worse. You may want to avoid:  Caffeine and alcohol.  Chocolate.  Mints.  Garlic and onions.  Spicy foods.  Citrus fruits, such as oranges, lemons, or limes.  Foods that contain tomato, such as sauce, chili, salsa, and pizza.  Fried and fatty foods.  Avoid lying down for 3 hours before you go to bed or before you take a nap.  Eat small meals often, instead of large meals.  Wear loose-fitting clothing.  Do not wear anything tight around your waist.  Raise (elevate) the head of your bed 6 to 8 inches with wood blocks. Using extra pillows does not help.  Only take medicines as told by your doctor.  Do not take aspirin or  ibuprofen. GET HELP RIGHT AWAY IF:   You have pain in your arms, neck, jaw, teeth, or back.  Your pain gets worse or changes.  You feel sick to your stomach (nauseous), throw up (vomit), or sweat (diaphoresis).  You feel short of breath, or you pass out (faint).  Your throw up is green, yellow, black, or looks like coffee grounds or blood.  Your poop (stool) is red, bloody, or black. MAKE SURE YOU:   Understand these instructions.  Will watch your condition.  Will get help right away if you are not doing well or get worse. Document Released: 03/31/2008 Document Revised: 01/05/2012 Document Reviewed: 05/02/2011 [facility] Patient Information 2015 [place], [place]. This information is not intended to replace advice given to you by your health care provider. Make sure you discuss any questions you have with your health care provider.

## 2014-10-24 ENCOUNTER — Ambulatory Visit: Payer: BC Managed Care – PPO | Admitting: Family Medicine

## 2014-12-25 ENCOUNTER — Other Ambulatory Visit: Payer: Self-pay

## 2014-12-25 MED ORDER — ALLOPURINOL 300 MG PO TABS: ORAL_TABLET | ORAL | Status: DC

## 2014-12-25 MED ORDER — LISINOPRIL 40 MG PO TABS: 40.0000 mg | ORAL_TABLET | Freq: Every day | ORAL | Status: DC

## 2014-12-25 MED ORDER — AMLODIPINE BESYLATE 10 MG PO TABS: ORAL_TABLET | ORAL | Status: DC

## 2014-12-25 MED ORDER — TERAZOSIN HCL 10 MG PO CAPS: 10.0000 mg | ORAL_CAPSULE | Freq: Every day | ORAL | Status: DC

## 2014-12-25 NOTE — Telephone Encounter (Signed)
z

## 2015-03-23 ENCOUNTER — Other Ambulatory Visit: Payer: Self-pay | Admitting: Physician Assistant

## 2015-03-23 NOTE — Telephone Encounter (Signed)
I have not saw this patient for these medications. He hasn't had labs since Feb 15. Ntbs & have  labs for refills   Tiffany A. Chauncey ReadingGann PA-C

## 2015-03-23 NOTE — Telephone Encounter (Signed)
Last seen 09/29/14 James Solis  Requesting 90 day supply

## 2015-03-29 ENCOUNTER — Encounter: Payer: Self-pay | Admitting: Family Medicine

## 2015-03-29 ENCOUNTER — Encounter: Payer: Self-pay | Admitting: Gastroenterology

## 2015-03-29 ENCOUNTER — Ambulatory Visit (INDEPENDENT_AMBULATORY_CARE_PROVIDER_SITE_OTHER): Payer: BLUE CROSS/BLUE SHIELD | Admitting: Family Medicine

## 2015-03-29 VITALS — BP 149/92 | HR 60 | Temp 97.4°F | Ht 73.0 in | Wt 266.0 lb

## 2015-03-29 DIAGNOSIS — Z0000 Encounter for general adult medical examination without abnormal findings: Secondary | ICD-10-CM

## 2015-03-29 DIAGNOSIS — Z1211 Encounter for screening for malignant neoplasm of colon: Secondary | ICD-10-CM | POA: Diagnosis not present

## 2015-03-29 MED ORDER — METOPROLOL SUCCINATE ER 50 MG PO TB24: 50.0000 mg | ORAL_TABLET | Freq: Every day | ORAL | Status: DC

## 2015-03-29 MED ORDER — AMLODIPINE BESYLATE 10 MG PO TABS: 10.0000 mg | ORAL_TABLET | Freq: Every day | ORAL | Status: DC

## 2015-03-29 MED ORDER — TERAZOSIN HCL 10 MG PO CAPS: 10.0000 mg | ORAL_CAPSULE | Freq: Every day | ORAL | Status: DC

## 2015-03-29 MED ORDER — FLUOCINONIDE 0.05 % EX OINT: TOPICAL_OINTMENT | Freq: Two times a day (BID) | CUTANEOUS | Status: DC

## 2015-03-29 MED ORDER — LISINOPRIL 40 MG PO TABS: 40.0000 mg | ORAL_TABLET | Freq: Every day | ORAL | Status: DC

## 2015-03-29 MED ORDER — ALLOPURINOL 300 MG PO TABS: ORAL_TABLET | ORAL | Status: DC

## 2015-03-29 NOTE — Progress Notes (Signed)
Subjective:    Patient ID: Jolyn Nap., male    DOB: [birth year], 64 y.o.   MRN: [id number]  HPI 64 year old man who is here for annual physical. His only problem is hypertension and that is significant because he is on optimal doses of 3 different classes of blood pressure pills and pressure is still elevated. He has no other complaints. He requests that we monitor lipids and a PSA so will do some blood work today  Patient Active Problem List   Diagnosis Date Noted  . Need for zoster vaccine 01/26/2014  . Hyperlipidemia   . Hypertension   . Gout   . Cellulitis and abscess of upper arm and forearm 06/22/2013   Outpatient Encounter Prescriptions as of 03/29/2015  Medication Sig  . allopurinol (ZYLOPRIM) 300 MG tablet TAKE ONE TABLET BY MOUTH ONE TIME DAILY  . amLODipine (NORVASC) 10 MG tablet TAKE 1 TABLET ONCE DAILY  . aspirin (LONGS ADULT LOW STRENGTH ASA) 81 MG EC tablet Take 81 mg by mouth daily.   . fluocinonide ointment (LIDEX) 0.05 %   . lisinopril (PRINIVIL,ZESTRIL) 40 MG tablet Take 1 tablet (40 mg total) by mouth daily.  Marland Kitchen terazosin (HYTRIN) 10 MG capsule TAKE 1 CAPSULE AT BEDTIME  . [DISCONTINUED] omeprazole (PRILOSEC) 20 MG capsule Take 1 capsule (20 mg total) by mouth daily.  . [DISCONTINUED] cetirizine (ZYRTEC) 10 MG tablet Take 1 tablet (10 mg total) by mouth daily.  . [DISCONTINUED] fluticasone (FLONASE) 50 MCG/ACT nasal spray Place 2 sprays into both nostrils daily.  . [DISCONTINUED] minocycline (MINOCIN,DYNACIN) 100 MG capsule Take 100 mg by mouth 2 (two) times daily.   No facility-administered encounter medications on file as of 03/29/2015.       Review of Systems  Constitutional: Negative.   HENT: Negative.   Eyes: Negative.   Respiratory: Negative.  Negative for shortness of breath.   Cardiovascular: Negative.  Negative for chest pain and leg swelling.  Gastrointestinal: Negative.   Genitourinary: Negative.   Musculoskeletal: Negative.   Skin: Negative.    Neurological: Negative.   Psychiatric/Behavioral: Negative.   All other systems reviewed and are negative.      Objective:   Physical Exam  Constitutional: He is oriented to person, place, and time. He appears well-developed and well-nourished.  HENT:  Head: Normocephalic.  Right Ear: External ear normal.  Left Ear: External ear normal.  Nose: Nose normal.  Mouth/Throat: Oropharynx is clear and moist.  Eyes: Conjunctivae and EOM are normal. Pupils are equal, round, and reactive to light.  Neck: Normal range of motion. Neck supple.  Cardiovascular: Normal rate, regular rhythm, normal heart sounds and intact distal pulses.   Pulmonary/Chest: Effort normal and breath sounds normal.  Abdominal: Soft. Bowel sounds are normal.  Genitourinary: Prostate normal.  Musculoskeletal: Normal range of motion.  Neurological: He is alert and oriented to person, place, and time.  Skin: Skin is warm and dry.  Psychiatric: He has a normal mood and affect. His behavior is normal. Judgment and thought content normal.  Vitals reviewed.   BP 149/92 mmHg  Pulse 60  Temp(Src) 97.4 F (36.3 C) (Oral)  Ht 6' 1"  (1.854 m)  Wt 266 lb (120.657 kg)  BMI 35.10 kg/m2      Assessment & Plan:  1. Health care maintenance  - fluocinonide ointment (LIDEX) 0.05 %; Apply topically 2 (two) times daily.  Dispense: 30 g; Refill: 1  2. Healthcare maintenance Regarding blood pressure will add metoprolol ER 50 mg to current  regimen have asked him to recheck blood pressure in one month - PSA Total+%Free (Serial) - CMP14+EGFR - Lipid panel - Uric acid  3. Screening for colon cancer  - Ambulatory referral to Gastroenterology  Wardell Honour MD

## 2015-03-30 LAB — URIC ACID: Uric Acid: 4.4 mg/dL (ref 3.7–8.6)

## 2015-03-30 LAB — CMP14+EGFR
ALT: 26 IU/L (ref 0–44)
AST: 21 IU/L (ref 0–40)
Albumin/Globulin Ratio: 2.1 (ref 1.1–2.5)
Albumin: 4.4 g/dL (ref 3.6–4.8)
Alkaline Phosphatase: 76 IU/L (ref 39–117)
BUN/Creatinine Ratio: 13 (ref 10–22)
BUN: 13 mg/dL (ref 8–27)
Bilirubin Total: 0.7 mg/dL (ref 0.0–1.2)
CO2: 25 mmol/L (ref 18–29)
Calcium: 9.1 mg/dL (ref 8.6–10.2)
Chloride: 101 mmol/L (ref 97–108)
Creatinine, Ser: 0.97 mg/dL (ref 0.76–1.27)
GFR calc Af Amer: 97 mL/min/{1.73_m2} (ref >59)
GFR calc non Af Amer: 84 mL/min/{1.73_m2} (ref >59)
Globulin, Total: 2.1 g/dL (ref 1.5–4.5)
Glucose: 96 mg/dL (ref 65–99)
Potassium: 3.7 mmol/L (ref 3.5–5.2)
Sodium: 141 mmol/L (ref 134–144)
Total Protein: 6.5 g/dL (ref 6.0–8.5)

## 2015-03-30 LAB — PSA TOTAL+% FREE (SERIAL)
PSA, Free Pct: 50 %
PSA, Free: 1.1 ng/mL
Prostate Specific Ag, Serum: 2.2 ng/mL (ref 0.0–4.0)

## 2015-03-30 LAB — LIPID PANEL
Chol/HDL Ratio: 4 ratio units (ref 0.0–5.0)
Cholesterol, Total: 141 mg/dL (ref 100–199)
HDL: 35 mg/dL — ABNORMAL LOW (ref >39)
LDL Calculated: 80 mg/dL (ref 0–99)
Triglycerides: 132 mg/dL (ref 0–149)
VLDL Cholesterol Cal: 26 mg/dL (ref 5–40)

## 2015-03-31 ENCOUNTER — Other Ambulatory Visit: Payer: Self-pay | Admitting: Family Medicine

## 2015-03-31 NOTE — Telephone Encounter (Signed)
Pt notified RXs were sent into [facility] Verbalizes understanding

## 2015-05-03 ENCOUNTER — Encounter: Payer: Self-pay | Admitting: Family Medicine

## 2015-05-03 ENCOUNTER — Ambulatory Visit (INDEPENDENT_AMBULATORY_CARE_PROVIDER_SITE_OTHER): Payer: BLUE CROSS/BLUE SHIELD | Admitting: Family Medicine

## 2015-05-03 VITALS — BP 152/87 | HR 57 | Temp 98.1°F | Ht 73.0 in | Wt 270.0 lb

## 2015-05-03 DIAGNOSIS — I10 Essential (primary) hypertension: Secondary | ICD-10-CM | POA: Diagnosis not present

## 2015-05-03 NOTE — Progress Notes (Signed)
   Subjective:    Patient ID: James BenAlvin Levitz Jr., male    DOB: [birth year], 64 y.o.   MRN: [id number]  HPI Patient here today for 1 month follow up on htn. At his last visit I added metoprolol 50 mg. He has had no side effects and has not monitored blood pressure but today pressure is still not at goal but I think probably better than it would be if he were not being treated. We also discussed lab reports from last visit and let him ask questions about that. He specifically asked about uric acid and need for allopurinol.     Patient Active Problem List   Diagnosis Date Noted  . Need for zoster vaccine 01/26/2014  . Hyperlipidemia   . Hypertension   . Gout   . Cellulitis and abscess of upper arm and forearm 06/22/2013   Outpatient Encounter Prescriptions as of 05/03/2015  Medication Sig  . allopurinol (ZYLOPRIM) 300 MG tablet TAKE ONE TABLET BY MOUTH ONE TIME DAILY  . amLODipine (NORVASC) 10 MG tablet Take 1 tablet (10 mg total) by mouth daily.  Marland Kitchen. aspirin (LONGS ADULT LOW STRENGTH ASA) 81 MG EC tablet Take 81 mg by mouth daily.   . fluocinonide ointment (LIDEX) 0.05 % Apply topically 2 (two) times daily.  Marland Kitchen. lisinopril (PRINIVIL,ZESTRIL) 40 MG tablet Take 1 tablet (40 mg total) by mouth daily.  Marland Kitchen. terazosin (HYTRIN) 10 MG capsule Take 1 capsule (10 mg total) by mouth at bedtime.  . [DISCONTINUED] metoprolol succinate (TOPROL-XL) 50 MG 24 hr tablet Take 1 tablet (50 mg total) by mouth daily. Take with or immediately following a meal.   No facility-administered encounter medications on file as of 05/03/2015.      Review of Systems  Constitutional: Negative.   HENT: Negative.   Eyes: Negative.   Respiratory: Negative.   Cardiovascular: Negative.   Gastrointestinal: Negative.   Endocrine: Negative.   Genitourinary: Negative.   Musculoskeletal: Negative.   Skin: Negative.   Allergic/Immunologic: Negative.   Neurological: Negative.   Hematological: Negative.   Psychiatric/Behavioral:  Negative.        Objective:   Physical Exam  Constitutional: He is oriented to person, place, and time. He appears well-developed and well-nourished.  Cardiovascular: Normal rate and regular rhythm.   I think he does have some degree of beta blockade with a rate of 57 today  Pulmonary/Chest: Effort normal and breath sounds normal.  Neurological: He is alert and oriented to person, place, and time.   BP 152/87 mmHg  Pulse 57  Temp(Src) 98.1 F (36.7 C) (Oral)  Ht 6\' 1"  (1.854 m)  Wt 270 lb (122.471 kg)  BMI 35.63 kg/m2        Assessment & Plan:  1. Essential hypertension As stated above blood pressure is a little better but not at goal. He is on 4 different classes of medicines now and he thought he was on a diaphoretic with the Ace inhibitor but that does not agree with her records. If he is not on diaphoretic I would add low-dose to the lisinopril  Frederica KusterStephen M Miller MD

## 2015-05-30 ENCOUNTER — Ambulatory Visit (AMBULATORY_SURGERY_CENTER): Payer: Self-pay

## 2015-05-30 VITALS — Ht 73.0 in | Wt 273.0 lb

## 2015-05-30 DIAGNOSIS — Z1211 Encounter for screening for malignant neoplasm of colon: Secondary | ICD-10-CM

## 2015-05-30 MED ORDER — PEG-KCL-NACL-NASULF-NA ASC-C 100 G PO SOLR: 1.0000 | Freq: Once | ORAL | Status: DC

## 2015-05-30 NOTE — Progress Notes (Signed)
No hx of anesthesia complications No allergies to egg or soy Pt doesn't take diet medications or diet drugs Not on home 02 Last colonoscopy [facility], [place] > 10 yrs ago. Pt doesn't know md name.

## 2015-05-31 ENCOUNTER — Encounter: Payer: Self-pay | Admitting: Gastroenterology

## 2015-06-06 ENCOUNTER — Ambulatory Visit (INDEPENDENT_AMBULATORY_CARE_PROVIDER_SITE_OTHER): Payer: BLUE CROSS/BLUE SHIELD | Admitting: Family Medicine

## 2015-06-06 ENCOUNTER — Encounter: Payer: Self-pay | Admitting: Family Medicine

## 2015-06-06 VITALS — BP 153/82 | HR 68 | Temp 97.6°F | Ht 73.0 in | Wt 270.6 lb

## 2015-06-06 DIAGNOSIS — I10 Essential (primary) hypertension: Secondary | ICD-10-CM

## 2015-06-06 MED ORDER — HYDROCHLOROTHIAZIDE 25 MG PO TABS: 25.0000 mg | ORAL_TABLET | Freq: Every day | ORAL | Status: DC

## 2015-06-06 NOTE — Progress Notes (Signed)
   Subjective:    Patient ID: James Nap., male    DOB: 1950-11-18, 64 y.o.   MRN: [id number]  HPI 65 year old gentleman here to follow-up blood pressure. At his last visit pressure was elevated and metoprolol was added to regimen of amlodipine and lisinopril. He will monitor his blood pressure and says metoprolol did not affected any which is hard to believe. In looking back through the records he had been on lisinopril and hydrochlorothiazide combination. He does report some edema. Also note a history of gout but most recent uric acid levels of 4.4 5.5 are certainly within normal limits on allopurinol. I do think he would benefit from addition of diaphoretic treatment for his blood pressure while keeping an eye on the uric acid level. He has never had an attack of gout but was given allopurinol apparently because uric acid level was elevated at some point    Review of Systems  Constitutional: Negative.   HENT: Negative.   Eyes: Negative.   Respiratory: Negative.  Negative for shortness of breath.   Cardiovascular: Negative.  Negative for chest pain and leg swelling.  Gastrointestinal: Negative.   Genitourinary: Negative.   Musculoskeletal: Negative.   Skin: Negative.   Neurological: Negative.   Psychiatric/Behavioral: Negative.   All other systems reviewed and are negative.  Patient Active Problem List   Diagnosis Date Noted  . Need for zoster vaccine 01/26/2014  . Hyperlipidemia   . Hypertension   . Gout   . Cellulitis and abscess of upper arm and forearm 06/22/2013   Outpatient Encounter Prescriptions as of 06/06/2015  Medication Sig  . allopurinol (ZYLOPRIM) 300 MG tablet TAKE ONE TABLET BY MOUTH ONE TIME DAILY  . amLODipine (NORVASC) 10 MG tablet Take 1 tablet (10 mg total) by mouth daily.  Marland Kitchen aspirin (LONGS ADULT LOW STRENGTH ASA) 81 MG EC tablet Take 81 mg by mouth daily.   . fluocinonide ointment (LIDEX) 0.05 % Apply topically 2 (two) times daily.  Marland Kitchen lisinopril  (PRINIVIL,ZESTRIL) 40 MG tablet Take 1 tablet (40 mg total) by mouth daily.  . peg 3350 powder (MOVIPREP) 100 G SOLR Take 1 kit (200 g total) by mouth once. Brand name only, no substitutions, take as directed.  . terazosin (HYTRIN) 10 MG capsule Take 1 capsule (10 mg total) by mouth at bedtime.  . metoprolol (LOPRESSOR) 50 MG tablet Take 50 mg by mouth daily.   No facility-administered encounter medications on file as of 06/06/2015.       Objective:   Physical Exam  Constitutional: He is oriented to person, place, and time. He appears well-developed and well-nourished.  Cardiovascular: Normal rate and regular rhythm.   Pulmonary/Chest: Effort normal and breath sounds normal.  Musculoskeletal: He exhibits edema.  Edema is 1+ at the ankles  Neurological: He is alert and oriented to person, place, and time.          Assessment & Plan:   1. Essential hypertension Blood pressure not yet well controlled. Will add diarrhetic while keeping on uric acid. If blood pressure normalizes the combined HCTZ and lisinopril and 1 pill to take along with amlodipine  Wardell Honour MD

## 2015-06-08 ENCOUNTER — Encounter: Payer: Self-pay | Admitting: *Deleted

## 2015-06-13 ENCOUNTER — Ambulatory Visit (AMBULATORY_SURGERY_CENTER): Payer: BLUE CROSS/BLUE SHIELD | Admitting: Gastroenterology

## 2015-06-13 ENCOUNTER — Encounter: Payer: Self-pay | Admitting: Gastroenterology

## 2015-06-13 VITALS — BP 150/90 | HR 55 | Temp 96.5°F | Resp 14 | Ht 73.0 in | Wt 273.0 lb

## 2015-06-13 DIAGNOSIS — Z1211 Encounter for screening for malignant neoplasm of colon: Secondary | ICD-10-CM

## 2015-06-13 MED ORDER — SODIUM CHLORIDE 0.9 % IV SOLN: 500.0000 mL | INTRAVENOUS | Status: DC

## 2015-06-13 NOTE — Op Note (Signed)
[facility] [address] [place], [address]   COLONOSCOPY PROCEDURE REPORT  PATIENT: James Solis, James Solis  MR#: [id number] BIRTHDATE: 09/12/1951 , 63  yrs. old GENDER: male ENDOSCOPIST: Rachael Fee, MD REFERRED NU:[id number] Hyacinth Meeker, M.D. PROCEDURE DATE:  06/13/2015 PROCEDURE:   Colonoscopy, screening First Screening Colonoscopy - Avg.  risk and is 50 yrs.  old or older - No.  Prior Negative Screening - Now for repeat screening. 10 or more years since last screening  History of Adenoma - Now for follow-up colonoscopy & has been > or = to 3 yrs.  N/A  Recommend repeat exam, <10 yrs? No ASA CLASS:   Class II INDICATIONS:Screening for colonic neoplasia and Colorectal Neoplasm Risk Assessment for this procedure is average risk. MEDICATIONS: Monitored anesthesia care and Propofol 175 mg IV  DESCRIPTION OF PROCEDURE:   After the risks benefits and alternatives of the procedure were thoroughly explained, informed consent was obtained.  The digital rectal exam revealed no abnormalities of the rectum.   The LB [id number]  endoscope was introduced through the anus and advanced to the cecum, which was identified by both the appendix and ileocecal valve. No adverse events experienced.   The quality of the prep was excellent.  The instrument was then slowly withdrawn as the colon was fully examined. Estimated blood loss is zero unless otherwise noted in this procedure report.   COLON FINDINGS: There was moderate diverticulosis noted in the left colon.   The examination was otherwise normal.  Retroflexed views revealed no abnormalities. The time to cecum = 1.3 Withdrawal time = 6.2   The scope was withdrawn and the procedure completed. COMPLICATIONS: There were no immediate complications.  ENDOSCOPIC IMPRESSION: 1.   Moderate diverticulosis was noted in the left colon 2.   The examination was otherwise normal; no polyps or cancers  RECOMMENDATIONS: You should continue  to follow colorectal cancer screening guidelines for "routine risk" patients with a repeat colonoscopy in 10 years.   eSigned:  Rachael Fee, MD 06/13/2015 11:07 AM

## 2015-06-13 NOTE — Progress Notes (Signed)
Transferred to recovery room. A/O x3, pleased with MAC.  VSS.  Report to Wendy, RN. 

## 2015-06-13 NOTE — Patient Instructions (Signed)
YOU HAD AN ENDOSCOPIC PROCEDURE TODAY AT THE [facility]:   Refer to the procedure report that was given to you for any specific questions about what was found during the examination.  If the procedure report does not answer your questions, please call your gastroenterologist to clarify.  If you requested that your care partner not be given the details of your procedure findings, then the procedure report has been included in a sealed envelope for you to review at your convenience later.  YOU SHOULD EXPECT: Some feelings of bloating in the abdomen. Passage of more gas than usual.  Walking can help get rid of the air that was put into your GI tract during the procedure and reduce the bloating. If you had a lower endoscopy (such as a colonoscopy or flexible sigmoidoscopy) you may notice spotting of blood in your stool or on the toilet paper. If you underwent a bowel prep for your procedure, you may not have a normal bowel movement for a few days.  Please Note:  You might notice some irritation and congestion in your nose or some drainage.  This is from the oxygen used during your procedure.  There is no need for concern and it should clear up in a day or so.  SYMPTOMS TO REPORT IMMEDIATELY:   Following lower endoscopy (colonoscopy or flexible sigmoidoscopy):  Excessive amounts of blood in the stool  Significant tenderness or worsening of abdominal pains  Swelling of the abdomen that is new, acute  Fever of 100F or higher   For urgent or emergent issues, a gastroenterologist can be reached at any hour by calling [phone].   DIET: Your first meal following the procedure should be a small meal and then it is ok to progress to your normal diet. Heavy or fried foods are harder to digest and may make you feel nauseous or bloated.  Likewise, meals heavy in dairy and vegetables can increase bloating.  Drink plenty of fluids but you should avoid alcoholic beverages for 24  hours.  ACTIVITY:  You should plan to take it easy for the rest of today and you should NOT DRIVE or use heavy machinery until tomorrow (because of the sedation medicines used during the test).    FOLLOW UP: Our staff will call the number listed on your records the next business day following your procedure to check on you and address any questions or concerns that you may have regarding the information given to you following your procedure. If we do not reach you, we will leave a message.  However, if you are feeling well and you are not experiencing any problems, there is no need to return our call.  We will assume that you have returned to your regular daily activities without incident.  If any biopsies were taken you will be contacted by phone or by letter within the next 1-3 weeks.  Please call us at [phone] if you have not heard about the biopsies in 3 weeks.    SIGNATURES/CONFIDENTIALITY: You and/or your care partner have signed paperwork which will be entered into your electronic medical record.  These signatures attest to the fact that that the information above on your After Visit Summary has been reviewed and is understood.  Full responsibility of the confidentiality of this discharge information lies with you and/or your care-partner.  Please review diverticulosis and high fiber diet handouts provided. 

## 2015-06-14 ENCOUNTER — Telehealth: Payer: Self-pay | Admitting: Emergency Medicine

## 2015-06-14 NOTE — Telephone Encounter (Signed)
  Follow up Call-  Call back number 06/13/2015  Post procedure Call Back phone  # [phone]  Permission to leave phone message Yes     Patient questions:  Do you have a fever, pain , or abdominal swelling? No. Pain Score  0 *  Have you tolerated food without any problems? Yes.    Have you been able to return to your normal activities? Yes.    Do you have any questions about your discharge instructions: Diet   No. Medications  No. Follow up visit  No.  Do you have questions or concerns about your Care? No.  Actions: * If pain score is 4 or above: No action needed, pain <4.

## 2015-06-19 ENCOUNTER — Other Ambulatory Visit: Payer: Self-pay

## 2015-06-19 MED ORDER — TERAZOSIN HCL 10 MG PO CAPS: 10.0000 mg | ORAL_CAPSULE | Freq: Every day | ORAL | Status: DC

## 2015-06-19 MED ORDER — AMLODIPINE BESYLATE 10 MG PO TABS: 10.0000 mg | ORAL_TABLET | Freq: Every day | ORAL | Status: DC

## 2015-07-03 ENCOUNTER — Encounter: Payer: Self-pay | Admitting: Family Medicine

## 2015-07-03 ENCOUNTER — Ambulatory Visit (INDEPENDENT_AMBULATORY_CARE_PROVIDER_SITE_OTHER): Payer: BLUE CROSS/BLUE SHIELD | Admitting: Family Medicine

## 2015-07-03 VITALS — BP 140/77 | HR 67 | Temp 97.3°F | Ht 73.0 in | Wt 267.0 lb

## 2015-07-03 DIAGNOSIS — N410 Acute prostatitis: Secondary | ICD-10-CM

## 2015-07-03 MED ORDER — CIPROFLOXACIN HCL 500 MG PO TABS: 500.0000 mg | ORAL_TABLET | Freq: Two times a day (BID) | ORAL | Status: DC

## 2015-07-03 NOTE — Addendum Note (Signed)
Addended by: Gwenith Daily on: 07/03/2015 04:49 PM   Modules accepted: Orders

## 2015-07-03 NOTE — Progress Notes (Signed)
   Subjective:    Patient ID: James Ben., male    DOB: 09-May-1951, 64 y.o.   MRN: [id number]  HPI patient was seen in urgent care over the weekend and diagnosed with prostatitis based on symptoms and exam. Urinalysis was negative as would be expected. He was started on Flomax and Septra and feels some better but comes today for my advice and another opinion. There is no past history of prostatitis. He was having some dysuria and frequency initially but that has improved  Patient Active Problem List   Diagnosis Date Noted  . Need for zoster vaccine 01/26/2014  . Hyperlipidemia   . Hypertension   . Gout   . Cellulitis and abscess of upper arm and forearm 06/22/2013   Outpatient Encounter Prescriptions as of 07/03/2015  Medication Sig  . amLODipine (NORVASC) 10 MG tablet Take 1 tablet (10 mg total) by mouth daily.  Marland Kitchen aspirin (LONGS ADULT LOW STRENGTH ASA) 81 MG EC tablet Take 81 mg by mouth daily.   . fluocinonide ointment (LIDEX) 0.05 % Apply topically 2 (two) times daily.  Marland Kitchen lisinopril (PRINIVIL,ZESTRIL) 40 MG tablet Take 1 tablet (40 mg total) by mouth daily.  Marland Kitchen allopurinol (ZYLOPRIM) 300 MG tablet TAKE ONE TABLET BY MOUTH ONE TIME DAILY  . hydrochlorothiazide (HYDRODIURIL) 25 MG tablet Take 1 tablet (25 mg total) by mouth daily. (Patient not taking: Reported on 07/03/2015)  . metoprolol (LOPRESSOR) 50 MG tablet Take 50 mg by mouth daily.  Marland Kitchen sulfamethoxazole-trimethoprim (BACTRIM DS,SEPTRA DS) 800-160 MG per tablet Take 1 tablet by mouth 2 (two) times daily.  . tamsulosin (FLOMAX) 0.4 MG CAPS capsule TAKE 1 CAPSULE 3O MINUTES AFTER SAME MEAL EACH DAY  . terazosin (HYTRIN) 10 MG capsule Take 1 capsule (10 mg total) by mouth at bedtime. (Patient not taking: Reported on 07/03/2015)   No facility-administered encounter medications on file as of 07/03/2015.      Review of Systems  Constitutional: Positive for fever.  Genitourinary: Positive for dysuria and frequency.       Objective:   Physical Exam  Constitutional: He appears well-developed and well-nourished.  Cardiovascular: Normal rate.   Pulmonary/Chest: Effort normal.  Genitourinary:  Repeat prostate exam was omitted as he just had one and would not rather have another one    BP 140/77 mmHg  Pulse 67  Temp(Src) 97.3 F (36.3 C) (Oral)  Ht  (1.854 m)  Wt 267 lb (121.11 kg)  BMI 35.23 kg/m2       Assessment & Plan:  1. Acute prostatitis Agree with management of this point I would however substitute quinolone, Cipro for the Septra. Encourage fluids talked about natural history of prostatitis length of treatment with one anabiotic versus another  Frederica Kuster MD

## 2015-07-09 ENCOUNTER — Ambulatory Visit: Payer: BLUE CROSS/BLUE SHIELD | Admitting: Family Medicine

## 2015-07-10 ENCOUNTER — Ambulatory Visit (INDEPENDENT_AMBULATORY_CARE_PROVIDER_SITE_OTHER): Payer: BLUE CROSS/BLUE SHIELD | Admitting: Family Medicine

## 2015-07-10 ENCOUNTER — Encounter: Payer: Self-pay | Admitting: Family Medicine

## 2015-07-10 VITALS — BP 159/87 | HR 57 | Temp 97.5°F | Ht 73.0 in | Wt 272.0 lb

## 2015-07-10 DIAGNOSIS — I10 Essential (primary) hypertension: Secondary | ICD-10-CM

## 2015-07-10 MED ORDER — LISINOPRIL-HYDROCHLOROTHIAZIDE 20-12.5 MG PO TABS: 2.0000 | ORAL_TABLET | Freq: Every day | ORAL | Status: DC

## 2015-07-10 NOTE — Progress Notes (Signed)
   Subjective:    Patient ID: James Ben., male    DOB: 1951/01/14, 64 y.o.   MRN: [id number]  HPI  64 year old gentleman here to follow-up for his blood pressure. He was seen last week related to prostatitis. The symptoms have more or less resolved but during the course of treatment his Hytrin was held and clue of Flomax and he stopped hydrochlorothiazide 2 weeks ago because he was having frequency. Now his blood pressure reflects that and is elevated today.     Patient Active Problem List   Diagnosis Date Noted  . Need for zoster vaccine 01/26/2014  . Hyperlipidemia   . Hypertension   . Gout   . Cellulitis and abscess of upper arm and forearm 06/22/2013   Outpatient Encounter Prescriptions as of 07/10/2015  Medication Sig  . allopurinol (ZYLOPRIM) 300 MG tablet TAKE ONE TABLET BY MOUTH ONE TIME DAILY  . amLODipine (NORVASC) 10 MG tablet Take 1 tablet (10 mg total) by mouth daily.  Marland Kitchen aspirin (LONGS ADULT LOW STRENGTH ASA) 81 MG EC tablet Take 81 mg by mouth daily.   . ciprofloxacin (CIPRO) 500 MG tablet Take 1 tablet (500 mg total) by mouth 2 (two) times daily.  . fluocinonide ointment (LIDEX) 0.05 % Apply topically 2 (two) times daily.  Marland Kitchen lisinopril (PRINIVIL,ZESTRIL) 40 MG tablet Take 1 tablet (40 mg total) by mouth daily.  . metoprolol (LOPRESSOR) 50 MG tablet Take 50 mg by mouth daily.  . tamsulosin (FLOMAX) 0.4 MG CAPS capsule TAKE 1 CAPSULE 3O MINUTES AFTER SAME MEAL EACH DAY  . terazosin (HYTRIN) 10 MG capsule Take 1 capsule (10 mg total) by mouth at bedtime.  . hydrochlorothiazide (HYDRODIURIL) 25 MG tablet Take 1 tablet (25 mg total) by mouth daily. (Patient not taking: Reported on 07/03/2015)  . sulfamethoxazole-trimethoprim (BACTRIM DS,SEPTRA DS) 800-160 MG per tablet Take 1 tablet by mouth 2 (two) times daily.   No facility-administered encounter medications on file as of 07/10/2015.      Review of Systems  Constitutional: Negative.   HENT: Negative.   Respiratory:  Negative.   Cardiovascular: Negative.   Genitourinary: Positive for frequency.  Neurological: Negative.   Psychiatric/Behavioral: Negative.        Objective:   Physical Exam  Constitutional: He is oriented to person, place, and time. He appears well-developed and well-nourished.  Cardiovascular: Normal rate and regular rhythm.   Pulmonary/Chest: Effort normal and breath sounds normal.  Neurological: He is alert and oriented to person, place, and time.  Psychiatric: He has a normal mood and affect. His behavior is normal.    BP 159/87 mmHg  Pulse 57  Temp(Src) 97.5 F (36.4 C) (Oral)  Ht  (1.854 m)  Wt 272 lb (123.378 kg)  BMI 35.89 kg/m2       Assessment & Plan:   1. Essential hypertension Blood pressure needs better control. Will continue lisinopril and hydrochlorothiazide as one pill to take along with metoprolol Hytrin and amlodipine. He will stop the Flomax at this point since he has no urinary symptoms. I have instructed him to continue the Cipro until it's gone  Frederica Kuster MD

## 2015-07-17 ENCOUNTER — Ambulatory Visit: Payer: BLUE CROSS/BLUE SHIELD

## 2015-07-21 ENCOUNTER — Other Ambulatory Visit: Payer: Self-pay | Admitting: Family Medicine

## 2015-07-31 ENCOUNTER — Other Ambulatory Visit: Payer: Self-pay | Admitting: Family Medicine

## 2015-08-13 ENCOUNTER — Telehealth: Payer: Self-pay | Admitting: Family Medicine

## 2015-08-13 NOTE — Telephone Encounter (Signed)
done

## 2015-09-04 ENCOUNTER — Ambulatory Visit (INDEPENDENT_AMBULATORY_CARE_PROVIDER_SITE_OTHER): Payer: BLUE CROSS/BLUE SHIELD

## 2015-09-04 VITALS — BP 137/76 | HR 56

## 2015-09-04 DIAGNOSIS — I10 Essential (primary) hypertension: Secondary | ICD-10-CM

## 2015-09-04 NOTE — Progress Notes (Signed)
Patient ID: James Benlvin Timoney Jr., male   DOB: 12/13/1950, 64 y.o.   MRN: [id number]   Patient returned to office today for a recheck of BP. BP today is 137/76 with a pulse of 56. He states that he monitors his BP at home with a machine and he will contact us if there are any further problems. Advised patient to follow up with Dr Hyacinth MeekerMiller after the first of the year. Patient verbalized understanding

## 2015-09-11 ENCOUNTER — Other Ambulatory Visit: Payer: Self-pay | Admitting: Family Medicine

## 2015-11-14 ENCOUNTER — Other Ambulatory Visit: Payer: Self-pay

## 2015-11-14 MED ORDER — LISINOPRIL-HYDROCHLOROTHIAZIDE 20-12.5 MG PO TABS: 2.0000 | ORAL_TABLET | Freq: Every day | ORAL | Status: DC

## 2016-02-14 ENCOUNTER — Other Ambulatory Visit: Payer: Self-pay | Admitting: Family Medicine

## 2016-02-14 NOTE — Telephone Encounter (Signed)
Last seen 07/10/15  Dr Hyacinth MeekerMiller

## 2016-03-11 ENCOUNTER — Other Ambulatory Visit: Payer: Self-pay | Admitting: Family Medicine

## 2016-03-25 ENCOUNTER — Other Ambulatory Visit: Payer: Self-pay | Admitting: Family Medicine

## 2016-03-26 ENCOUNTER — Ambulatory Visit (INDEPENDENT_AMBULATORY_CARE_PROVIDER_SITE_OTHER): Payer: BLUE CROSS/BLUE SHIELD | Admitting: Family Medicine

## 2016-03-26 ENCOUNTER — Encounter: Payer: Self-pay | Admitting: Family Medicine

## 2016-03-26 VITALS — BP 132/88 | HR 79 | Temp 98.1°F | Ht 73.0 in | Wt 271.4 lb

## 2016-03-26 DIAGNOSIS — I10 Essential (primary) hypertension: Secondary | ICD-10-CM | POA: Diagnosis not present

## 2016-03-26 MED ORDER — METOPROLOL SUCCINATE ER 50 MG PO TB24: ORAL_TABLET | ORAL | Status: DC

## 2016-03-26 MED ORDER — AMLODIPINE BESYLATE 10 MG PO TABS: 10.0000 mg | ORAL_TABLET | Freq: Every day | ORAL | Status: DC

## 2016-03-26 NOTE — Progress Notes (Signed)
Subjective:    Patient ID: James Ben., male    DOB: 05/09/1951, 65 y.o.   MRN: [id number]  HPI 65 year old gentleman here to follow-up with his hypertension. He is on a multidrug regimen including calcium channel blocker or ACE inhibitor blocker and centrally acting agent. Blood pressures generally are doing well but it does occasionally get up to160 systolic. He denies side effects to any medicines. He had previously been on allopurinol for elevated uric acid but has never had a gout attack so it was stopped. He has no specific complaints today. He did run out of his Toprol last week  Patient Active Problem List   Diagnosis Date Noted  . Need for zoster vaccine 01/26/2014  . Hyperlipidemia   . Hypertension   . Gout   . Cellulitis and abscess of upper arm and forearm 06/22/2013   Outpatient Encounter Prescriptions as of 03/26/2016  Medication Sig  . amLODipine (NORVASC) 10 MG tablet Take 1 tablet (10 mg total) by mouth daily.  . fluocinonide ointment (LIDEX) 0.05 % APPLY TOPICALLY 2 (TWO) TIMES DAILY.  Marland Kitchen lisinopril-hydrochlorothiazide (PRINZIDE,ZESTORETIC) 20-12.5 MG tablet Take 2 tablets by mouth daily.  . metoprolol (LOPRESSOR) 50 MG tablet Take 50 mg by mouth daily.  Marland Kitchen terazosin (HYTRIN) 10 MG capsule TAKE ONE CAPSULE BY MOUTH AT BEDTIME  . aspirin (LONGS ADULT LOW STRENGTH ASA) 81 MG EC tablet Take 81 mg by mouth daily.   . metoprolol succinate (TOPROL-XL) 50 MG 24 hr tablet TAKE 1 TABLET DAILY WITH OR IMMEDIATELY FOLLOWING A MEAL  . [DISCONTINUED] allopurinol (ZYLOPRIM) 300 MG tablet TAKE ONE TABLET BY MOUTH ONE TIME DAILY  . [DISCONTINUED] ciprofloxacin (CIPRO) 500 MG tablet Take 1 tablet (500 mg total) by mouth 2 (two) times daily.  . [DISCONTINUED] sulfamethoxazole-trimethoprim (BACTRIM DS,SEPTRA DS) 800-160 MG per tablet Take 1 tablet by mouth 2 (two) times daily.  . [DISCONTINUED] tamsulosin (FLOMAX) 0.4 MG CAPS capsule TAKE 1 CAPSULE 3O MINUTES AFTER SAME MEAL EACH DAY    No facility-administered encounter medications on file as of 03/26/2016.      Review of Systems  Constitutional: Negative.   HENT: Negative.   Eyes: Negative.   Respiratory: Negative.  Negative for shortness of breath.   Cardiovascular: Negative.  Negative for chest pain and leg swelling.  Gastrointestinal: Negative.   Genitourinary: Negative.   Musculoskeletal: Negative.   Skin: Negative.   Neurological: Negative.   Psychiatric/Behavioral: Negative.   All other systems reviewed and are negative.      Objective:   Physical Exam  Constitutional: He is oriented to person, place, and time. He appears well-developed and well-nourished.  Cardiovascular: Normal rate, regular rhythm, normal heart sounds and intact distal pulses.   Pulmonary/Chest: Effort normal and breath sounds normal.  Neurological: He is alert and oriented to person, place, and time.  Psychiatric: He has a normal mood and affect. His behavior is normal.   BP 132/88 mmHg  Pulse 79  Temp(Src) 98.1 F (36.7 C) (Oral)  Ht  (1.854 m)  Wt 271 lb 6.4 oz (123.106 kg)  BMI 35.81 kg/m2        Assessment & Plan:  1. Essential hypertension Continue with current regimen as blood pressure is adequately controlled most of the time. We discussed the risk factors of heart disease today basically he has a positive family history but other risk factors are controlled. He will return for a physical later this year at which time we will do some blood work  Frederica KusterStephen M Matia Zelada MD

## 2016-04-09 DIAGNOSIS — L309 Dermatitis, unspecified: Secondary | ICD-10-CM | POA: Diagnosis not present

## 2016-04-09 DIAGNOSIS — L281 Prurigo nodularis: Secondary | ICD-10-CM | POA: Diagnosis not present

## 2016-04-22 DIAGNOSIS — T783XXA Angioneurotic edema, initial encounter: Secondary | ICD-10-CM | POA: Diagnosis not present

## 2016-04-23 ENCOUNTER — Encounter: Payer: Self-pay | Admitting: Family Medicine

## 2016-04-23 ENCOUNTER — Ambulatory Visit (INDEPENDENT_AMBULATORY_CARE_PROVIDER_SITE_OTHER): Payer: BLUE CROSS/BLUE SHIELD | Admitting: Family Medicine

## 2016-04-23 ENCOUNTER — Telehealth: Payer: Self-pay | Admitting: Family Medicine

## 2016-04-23 VITALS — BP 159/82 | Temp 97.5°F | Ht 73.0 in | Wt 274.4 lb

## 2016-04-23 DIAGNOSIS — I10 Essential (primary) hypertension: Secondary | ICD-10-CM | POA: Diagnosis not present

## 2016-04-23 MED ORDER — IRBESARTAN 300 MG PO TABS: ORAL_TABLET | ORAL | Status: DC

## 2016-04-23 NOTE — Progress Notes (Signed)
   Subjective:    Patient ID: James BenAlvin Compston Jr., male    DOB: 1951/04/28, 65 y.o.   MRN: [id number]  HPI 65 year old gentleman who developed some swelling of the lips yesterday morning. He was seen at urgent care and the diagnosis was angina edema and lisinopril was stopped. He had no swelling of the tongue shortness of breath or wheezing. He presents today with that history with blood pressure elevated at 159/82. Other medicines include metoprolol succinate 100 mg once a day and amlodipine 10 mg once a day and Hytrin 10 mg  Patient Active Problem List   Diagnosis Date Noted  . Need for zoster vaccine 01/26/2014  . Hyperlipidemia   . Hypertension   . Gout   . Cellulitis and abscess of upper arm and forearm 06/22/2013   Outpatient Encounter Prescriptions as of 04/23/2016  Medication Sig  . amLODipine (NORVASC) 10 MG tablet Take 1 tablet (10 mg total) by mouth daily.  Marland Kitchen. aspirin (LONGS ADULT LOW STRENGTH ASA) 81 MG EC tablet Take 81 mg by mouth daily.   . fluocinonide ointment (LIDEX) 0.05 % APPLY TOPICALLY 2 (TWO) TIMES DAILY.  Marland Kitchen. lisinopril-hydrochlorothiazide (PRINZIDE,ZESTORETIC) 20-12.5 MG tablet Take 2 tablets by mouth daily.  . methylPREDNISolone (MEDROL DOSEPAK) 4 MG TBPK tablet   . metoprolol succinate (TOPROL-XL) 50 MG 24 hr tablet TAKE 1 TABLET DAILY WITH OR IMMEDIATELY FOLLOWING A MEAL  . minocycline (MINOCIN,DYNACIN) 100 MG capsule Take 100 mg by mouth 2 (two) times daily.  Marland Kitchen. terazosin (HYTRIN) 10 MG capsule TAKE ONE CAPSULE BY MOUTH AT BEDTIME  . [DISCONTINUED] metoprolol (LOPRESSOR) 50 MG tablet Take 50 mg by mouth daily.  . irbesartan (AVAPRO) 300 MG tablet Take as directed   No facility-administered encounter medications on file as of 04/23/2016.      Review of Systems  Constitutional: Negative.   HENT: Negative.   Eyes: Negative.   Respiratory: Negative.  Negative for shortness of breath.   Cardiovascular: Negative.  Negative for chest pain and leg swelling.    Gastrointestinal: Negative.   Genitourinary: Negative.   Musculoskeletal: Negative.   Skin: Negative.   Neurological: Negative.   Psychiatric/Behavioral: Negative.   All other systems reviewed and are negative.      Objective:   Physical Exam  Constitutional: He is oriented to person, place, and time. He appears well-developed and well-nourished.  Cardiovascular: Normal rate and regular rhythm.   Pulmonary/Chest: Effort normal and breath sounds normal.  Neurological: He is alert and oriented to person, place, and time.  Psychiatric: He has a normal mood and affect. His behavior is normal.   BP 159/82 mmHg  Temp(Src) 97.5 F (36.4 C) (Oral)  Ht 6\' 1"  (1.854 m)  Wt 274 lb 6.4 oz (124.467 kg)  BMI 36.21 kg/m2  SpO2 97%        Assessment & Plan:   1. Essential hypertension Patient needs addition of other blood pressure pill. I'm inclined to use an ARB. I explained there is about a 10% risk of recurrence of angioedema since it is related to the Ace. I think it is a logical alternative given the fact that he is already taking a calcium channel blocker and beta blocker. Other alternative might be a centrally acting agent such as clonidine. If he does develop swelling of the lips he is to stop the medicine immediately and take Benadryl.  Frederica KusterStephen M Miller MD

## 2016-04-23 NOTE — Telephone Encounter (Signed)
appt scheduled Pt notified 

## 2016-05-06 ENCOUNTER — Telehealth: Payer: Self-pay | Admitting: Family Medicine

## 2016-05-14 ENCOUNTER — Other Ambulatory Visit: Payer: Self-pay | Admitting: Family Medicine

## 2016-05-16 ENCOUNTER — Telehealth: Payer: Self-pay | Admitting: Family Medicine

## 2016-05-16 NOTE — Telephone Encounter (Signed)
Advised will need to follow up with provider as soon as can.  Monitor blood pressures close and try not to over exert physically while on vacation.

## 2016-05-16 NOTE — Telephone Encounter (Signed)
Pt needs to be seen

## 2016-05-29 ENCOUNTER — Encounter: Payer: Self-pay | Admitting: Family Medicine

## 2016-05-29 ENCOUNTER — Ambulatory Visit (INDEPENDENT_AMBULATORY_CARE_PROVIDER_SITE_OTHER): Payer: BLUE CROSS/BLUE SHIELD | Admitting: Family Medicine

## 2016-05-29 VITALS — BP 152/87 | HR 74 | Temp 97.3°F | Ht 73.0 in | Wt 281.0 lb

## 2016-05-29 DIAGNOSIS — I10 Essential (primary) hypertension: Secondary | ICD-10-CM | POA: Diagnosis not present

## 2016-05-29 MED ORDER — VALSARTAN 320 MG PO TABS: 320.0000 mg | ORAL_TABLET | Freq: Every day | ORAL | 3 refills | Status: DC

## 2016-05-29 NOTE — Progress Notes (Signed)
   Subjective:    Patient ID: James Ben., male    DOB: 06-11-1951, 65 y.o.   MRN: [id number]  HPI Patient here today for follow up on HTN and BP medications.  The ACE inhibitor was discontinued after an emergency room visit where patient had symptoms of allergy. The irbesartan was begun in its place. Patient had no allergic symptoms but he did not feel like his pressure responded well to irbesartan. He has gained 7 pounds in the last 1 month.     Patient Active Problem List   Diagnosis Date Noted  . Need for zoster vaccine 01/26/2014  . Hyperlipidemia   . Hypertension   . Gout   . Cellulitis and abscess of upper arm and forearm 06/22/2013   Outpatient Encounter Prescriptions as of 05/29/2016  Medication Sig  . amLODipine (NORVASC) 10 MG tablet Take 1 tablet (10 mg total) by mouth daily.  Marland Kitchen aspirin (LONGS ADULT LOW STRENGTH ASA) 81 MG EC tablet Take 81 mg by mouth daily.   . fluocinonide ointment (LIDEX) 0.05 % APPLY TOPICALLY 2 (TWO) TIMES DAILY.  Marland Kitchen lisinopril-hydrochlorothiazide (PRINZIDE,ZESTORETIC) 20-12.5 MG tablet TAKE 2 TABLETS BY MOUTH DAILY.  . metoprolol succinate (TOPROL-XL) 50 MG 24 hr tablet TAKE 1 TABLET DAILY WITH OR IMMEDIATELY FOLLOWING A MEAL  . terazosin (HYTRIN) 10 MG capsule TAKE ONE CAPSULE BY MOUTH AT BEDTIME  . [DISCONTINUED] methylPREDNISolone (MEDROL DOSEPAK) 4 MG TBPK tablet   . [DISCONTINUED] minocycline (MINOCIN,DYNACIN) 100 MG capsule Take 100 mg by mouth 2 (two) times daily.  . irbesartan (AVAPRO) 300 MG tablet Take as directed (Patient not taking: Reported on 05/29/2016)   No facility-administered encounter medications on file as of 05/29/2016.       Review of Systems  Constitutional: Negative.   Eyes: Negative.   Respiratory: Negative.   Cardiovascular: Negative.        Elevated BP  Gastrointestinal: Negative.   Endocrine: Negative.   Genitourinary: Negative.   Musculoskeletal: Negative.   Skin: Negative.   Allergic/Immunologic: Negative.     Neurological: Positive for headaches (very mild ).  Hematological: Negative.   Psychiatric/Behavioral: Negative.        Objective:   Physical Exam  Constitutional: He is oriented to person, place, and time. He appears well-developed and well-nourished.  Cardiovascular: Normal rate and regular rhythm.   Pulmonary/Chest: Effort normal and breath sounds normal.  Neurological: He is alert and oriented to person, place, and time.  Psychiatric: He has a normal mood and affect.   BP (!) 152/87 (BP Location: Left Arm)   Pulse 74   Temp 97.3 F (36.3 C) (Oral)   Ht 6\' 1"  (1.854 m)   Wt 281 lb (127.5 kg)   BMI 37.07 kg/m         Assessment & Plan:  1. Essential hypertension Blood pressure has been somewhat resistant despite multidrug regimen which includes calcium channel blocker and beta blocker. I cannot explain why irbesartan did not have more of a positive effect like to continue with arm in the form of valsartan 320 mg he will recheck pressures at home. Also expressed desire for him to work on his weight  Frederica Kuster MD

## 2016-05-30 ENCOUNTER — Ambulatory Visit: Payer: BLUE CROSS/BLUE SHIELD | Admitting: Family Medicine

## 2016-06-09 ENCOUNTER — Encounter: Payer: Self-pay | Admitting: Family Medicine

## 2016-06-09 NOTE — Telephone Encounter (Signed)
TC to patient w/ Dr. Christell ConstantMoore & Tammy's recommendations. Appt made for this Thursday w/ Dr. Hyacinth MeekerMiller for BP ck, labwork and possible referral to nephrologist

## 2016-06-09 NOTE — Telephone Encounter (Signed)
I recommend having patient come in for BP check and also check BMET.  There are 2 ARBs on his medication list - irbesartan and valsartan.  Make sure patient is taking either of these and not both.  Look like valsartan was started last 05/29/16 Also agree that if BP remains elevated then referral to nephrologist is warranted.

## 2016-06-12 ENCOUNTER — Ambulatory Visit (INDEPENDENT_AMBULATORY_CARE_PROVIDER_SITE_OTHER): Payer: BLUE CROSS/BLUE SHIELD | Admitting: Family Medicine

## 2016-06-12 ENCOUNTER — Encounter: Payer: Self-pay | Admitting: Family Medicine

## 2016-06-12 VITALS — BP 138/83 | HR 69 | Temp 97.2°F | Ht 73.0 in | Wt 281.8 lb

## 2016-06-12 DIAGNOSIS — I10 Essential (primary) hypertension: Secondary | ICD-10-CM | POA: Diagnosis not present

## 2016-06-12 MED ORDER — CHLORTHALIDONE 25 MG PO TABS: 25.0000 mg | ORAL_TABLET | Freq: Every day | ORAL | 1 refills | Status: DC

## 2016-06-12 NOTE — Progress Notes (Signed)
   Subjective:    Patient ID: James BenAlvin Flannagan Jr., male    DOB: [birth year], 65 y.o.   MRN: [id number]  HPI 65 year old gentleman who is here to follow-up hypertension. He is now on multidrug regimen including amlodipine metoprolol and valsartan and blood pressures have not been in the normal range at home when he has checked. He had formally been on ACE inhibitor with diuretic and blood pressure had been better controlled.  Patient Active Problem List   Diagnosis Date Noted  . Need for zoster vaccine 01/26/2014  . Hyperlipidemia   . Hypertension   . Gout   . Cellulitis and abscess of upper arm and forearm 06/22/2013   Outpatient Encounter Prescriptions as of 06/12/2016  Medication Sig  . amLODipine (NORVASC) 10 MG tablet Take 1 tablet (10 mg total) by mouth daily.  Marland Kitchen. aspirin (LONGS ADULT LOW STRENGTH ASA) 81 MG EC tablet Take 81 mg by mouth daily.   . fluocinonide ointment (LIDEX) 0.05 % APPLY TOPICALLY 2 (TWO) TIMES DAILY.  . metoprolol succinate (TOPROL-XL) 50 MG 24 hr tablet TAKE 1 TABLET DAILY WITH OR IMMEDIATELY FOLLOWING A MEAL  . terazosin (HYTRIN) 10 MG capsule TAKE ONE CAPSULE BY MOUTH AT BEDTIME  . valsartan (DIOVAN) 320 MG tablet Take 1 tablet (320 mg total) by mouth daily.  . [DISCONTINUED] irbesartan (AVAPRO) 300 MG tablet Take as directed (Patient not taking: Reported on 05/29/2016)   No facility-administered encounter medications on file as of 06/12/2016.       Review of Systems  Constitutional: Negative.   HENT: Negative.   Eyes: Negative.   Respiratory: Negative.  Negative for shortness of breath.   Cardiovascular: Negative.  Negative for chest pain and leg swelling.  Gastrointestinal: Negative.   Genitourinary: Negative.   Musculoskeletal: Negative.   Skin: Negative.   Neurological: Negative.   Psychiatric/Behavioral: Negative.   All other systems reviewed and are negative.      Objective:   Physical Exam  Constitutional: He is oriented to person, place, and  time. He appears well-developed and well-nourished.  Cardiovascular: Normal rate and regular rhythm.   Pulmonary/Chest: Effort normal.  Neurological: He is alert and oriented to person, place, and time.   BP 138/83   Pulse 69   Temp 97.2 F (36.2 C) (Oral)   Ht 6\' 1"  (1.854 m)   Wt 281 lb 12.8 oz (127.8 kg)   BMI 37.18 kg/m         Assessment & Plan:  1. Essential hypertension Continue medicines listed above but add chlorthalidone 25 mg. Patient will continue to monitor at home plan would be if the diuretic does not control pressure to increase metoprolol from 50-100 mg or more.  Frederica KusterStephen M Juley Giovanetti MD

## 2016-07-14 ENCOUNTER — Other Ambulatory Visit: Payer: Self-pay | Admitting: *Deleted

## 2016-07-14 MED ORDER — CHLORTHALIDONE 25 MG PO TABS: 25.0000 mg | ORAL_TABLET | Freq: Every day | ORAL | 1 refills | Status: DC

## 2016-07-15 ENCOUNTER — Other Ambulatory Visit: Payer: Self-pay

## 2016-07-15 MED ORDER — CHLORTHALIDONE 25 MG PO TABS: 25.0000 mg | ORAL_TABLET | Freq: Every day | ORAL | 0 refills | Status: DC

## 2016-09-18 ENCOUNTER — Other Ambulatory Visit: Payer: Self-pay | Admitting: Family Medicine

## 2016-09-24 ENCOUNTER — Other Ambulatory Visit: Payer: Self-pay | Admitting: Family Medicine

## 2016-10-08 ENCOUNTER — Telehealth: Payer: Self-pay | Admitting: Family Medicine

## 2016-10-09 NOTE — Telephone Encounter (Signed)
Scheduled

## 2016-10-16 ENCOUNTER — Ambulatory Visit (INDEPENDENT_AMBULATORY_CARE_PROVIDER_SITE_OTHER): Payer: BLUE CROSS/BLUE SHIELD | Admitting: *Deleted

## 2016-10-16 DIAGNOSIS — Z23 Encounter for immunization: Secondary | ICD-10-CM

## 2016-11-05 ENCOUNTER — Other Ambulatory Visit: Payer: Self-pay | Admitting: Family Medicine

## 2016-12-21 ENCOUNTER — Other Ambulatory Visit: Payer: Self-pay | Admitting: Family Medicine

## 2016-12-22 NOTE — Telephone Encounter (Signed)
Authorize 30 days only. Then contact the patient letting them know that they will need an appointment before any further prescriptions can be sent in. 

## 2017-01-04 ENCOUNTER — Other Ambulatory Visit: Payer: Self-pay | Admitting: Family Medicine

## 2017-01-29 ENCOUNTER — Ambulatory Visit (INDEPENDENT_AMBULATORY_CARE_PROVIDER_SITE_OTHER): Payer: BLUE CROSS/BLUE SHIELD | Admitting: Nurse Practitioner

## 2017-01-29 ENCOUNTER — Encounter: Payer: Self-pay | Admitting: Nurse Practitioner

## 2017-01-29 VITALS — BP 138/81 | HR 63 | Temp 97.3°F | Ht 73.0 in | Wt 285.4 lb

## 2017-01-29 DIAGNOSIS — L03116 Cellulitis of left lower limb: Secondary | ICD-10-CM

## 2017-01-29 MED ORDER — CIPROFLOXACIN HCL 500 MG PO TABS: 500.0000 mg | ORAL_TABLET | Freq: Two times a day (BID) | ORAL | 0 refills | Status: DC

## 2017-01-29 MED ORDER — FUROSEMIDE 20 MG PO TABS: ORAL_TABLET | ORAL | 3 refills | Status: DC

## 2017-01-29 NOTE — Patient Instructions (Signed)
Cellulitis, Adult Cellulitis is a skin infection. The infected area is usually red and sore. This condition occurs most often in the arms and lower legs. It is very important to get treated for this condition. Follow these instructions at home:  Take over-the-counter and prescription medicines only as told by your doctor.  If you were prescribed an antibiotic medicine, take it as told by your doctor. Do not stop taking the antibiotic even if you start to feel better.  Drink enough fluid to keep your pee (urine) clear or pale yellow.  Do not touch or rub the infected area.  Raise (elevate) the infected area above the level of your heart while you are sitting or lying down.  Place warm or cold wet cloths (warm or cold compresses) on the infected area. Do this as told by your doctor.  Keep all follow-up visits as told by your doctor. This is important. These visits let your doctor make sure your infection is not getting worse. Contact a doctor if:  You have a fever.  Your symptoms do not get better after 1-2 days of treatment.  Your bone or joint under the infected area starts to hurt after the skin has healed.  Your infection comes back. This can happen in the same area or another area.  You have a swollen bump in the infected area.  You have new symptoms.  You feel ill and also have muscle aches and pains. Get help right away if:  Your symptoms get worse.  You feel very sleepy.  You throw up (vomit) or have watery poop (diarrhea) for a long time.  There are red streaks coming from the infected area.  Your red area gets larger.  Your red area turns darker. This information is not intended to replace advice given to you by your health care provider. Make sure you discuss any questions you have with your health care provider. Document Released: 03/31/2008 Document Revised: 03/20/2016 Document Reviewed: 08/22/2015 Elsevier Interactive Patient Education  2017 Elsevier  Inc.  

## 2017-01-29 NOTE — Progress Notes (Signed)
   Subjective:    Patient ID: James Ben., male    DOB: June 24, 1951, 66 y.o.   MRN: [id number]  HPI Patient comes in c/o swelling of left lower leg. 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    Review of Systems  Constitutional: Negative.   Respiratory: Negative.   Cardiovascular: Negative.   Neurological: Negative.   Psychiatric/Behavioral: Negative.   All other systems reviewed and are negative.      Objective:   Physical Exam  Constitutional: He is oriented to person, place, and time. He appears well-developed and well-nourished. No distress.  Cardiovascular: Normal rate and regular rhythm.   Left lower leg 2+ edema with erythema and hot to touch No calf pain- negative homan sign  Pulmonary/Chest: Effort normal and breath sounds normal.  Neurological: He is alert and oriented to person, place, and time.  Skin: Skin is warm.  Psychiatric: He has a normal mood and affect. His behavior is normal. Judgment and thought content normal.   BP 138/81   Pulse 63   Temp 97.3 F (36.3 C) (Oral)   Ht  (1.854 m)   Wt 285 lb 6.4 oz (129.5 kg)   BMI 37.65 kg/m         Assessment & Plan:  1. Cellulitis of left lower extremity hold chlorthalidone while taking lasix Elevate leg when sitting RTO if does not improve. - ciprofloxacin (CIPRO) 500 MG tablet; Take 1 tablet (500 mg total) by mouth 2 (two) times daily.  Dispense: 20 tablet; Refill: 0 - furosemide (LASIX) 20 MG tablet; 1 po qd x 5 days then prn  Dispense: 20 tablet; Refill: 3  Mary-Margaret Daphine Deutscher, FNP

## 2017-02-02 ENCOUNTER — Other Ambulatory Visit: Payer: Self-pay | Admitting: Family Medicine

## 2017-03-11 ENCOUNTER — Encounter: Payer: Self-pay | Admitting: Family Medicine

## 2017-03-11 ENCOUNTER — Ambulatory Visit (INDEPENDENT_AMBULATORY_CARE_PROVIDER_SITE_OTHER): Payer: BLUE CROSS/BLUE SHIELD | Admitting: Family Medicine

## 2017-03-11 ENCOUNTER — Ambulatory Visit (INDEPENDENT_AMBULATORY_CARE_PROVIDER_SITE_OTHER): Payer: BLUE CROSS/BLUE SHIELD

## 2017-03-11 VITALS — BP 135/85 | HR 61 | Temp 97.6°F | Ht 73.0 in | Wt 283.0 lb

## 2017-03-11 DIAGNOSIS — R600 Localized edema: Secondary | ICD-10-CM | POA: Diagnosis not present

## 2017-03-11 DIAGNOSIS — R0602 Shortness of breath: Secondary | ICD-10-CM

## 2017-03-11 DIAGNOSIS — I10 Essential (primary) hypertension: Secondary | ICD-10-CM

## 2017-03-11 DIAGNOSIS — Z0000 Encounter for general adult medical examination without abnormal findings: Secondary | ICD-10-CM

## 2017-03-11 LAB — MICROSCOPIC EXAMINATION
Bacteria, UA: NONE SEEN
Epithelial Cells (non renal): NONE SEEN /hpf (ref 0–10)
RBC, UA: NONE SEEN /hpf (ref 0–?)
Renal Epithel, UA: NONE SEEN /hpf
WBC, UA: NONE SEEN /hpf (ref 0–?)

## 2017-03-11 LAB — URINALYSIS, COMPLETE
Bilirubin, UA: NEGATIVE
Glucose, UA: NEGATIVE
Ketones, UA: NEGATIVE
Leukocytes, UA: NEGATIVE
Nitrite, UA: NEGATIVE
Protein, UA: NEGATIVE
Specific Gravity, UA: 1.02 (ref 1.005–1.030)
Urobilinogen, Ur: 0.2 mg/dL (ref 0.2–1.0)
pH, UA: 5.5 (ref 5.0–7.5)

## 2017-03-11 NOTE — Progress Notes (Signed)
Your chest x-ray looked normal. Thanks, WS.

## 2017-03-11 NOTE — Progress Notes (Signed)
Subjective:  Patient ID: James Solis., male    DOB: March 31, 1951  Age: 66 y.o. MRN: [id number]  CC: Annual Exam (pt here today for CPE)   HPI James Solis. presents for CPE  History James Solis has a past medical history of Cough; Gout; Hyperlipidemia; Hypertension; Nasal congestion; Rash; and Sinusitis.   He has a past surgical history that includes Inguinal hernia repair (1997) and Colonoscopy (2006).   His family history includes Asthma in his mother; Coronary artery disease in his father.He reports that he has never smoked. He has never used smokeless tobacco. He reports that he does not drink alcohol or use drugs.  Current Outpatient Prescriptions on File Prior to Visit  Medication Sig Dispense Refill  . amLODipine (NORVASC) 10 MG tablet TAKE 1 TABLET EVERY DAY 90 tablet 0  . aspirin (LONGS ADULT LOW STRENGTH ASA) 81 MG EC tablet Take 81 mg by mouth daily.     . chlorthalidone (HYGROTON) 25 MG tablet TAKE 1 TABLET (25 MG TOTAL) BY MOUTH DAILY. 90 tablet 1  . furosemide (LASIX) 20 MG tablet 1 po qd x 5 days then prn 20 tablet 3  . metoprolol succinate (TOPROL-XL) 50 MG 24 hr tablet TAKE 1 TABLET DAILY WITH OR IMMEDIATELY FOLLOWING A MEAL 90 tablet 0  . terazosin (HYTRIN) 10 MG capsule TAKE ONE CAPSULE BY MOUTH AT BEDTIME 90 capsule 1  . valsartan (DIOVAN) 320 MG tablet TAKE 1 TABLET (320 MG TOTAL) BY MOUTH DAILY. 90 tablet 0   No current facility-administered medications on file prior to visit.     ROS Review of Systems  Constitutional: Negative for activity change, appetite change, chills, diaphoresis, fatigue, fever and unexpected weight change.  HENT: Negative for congestion, ear pain, hearing loss, postnasal drip, rhinorrhea, sore throat, tinnitus and trouble swallowing.   Eyes: Negative for photophobia, pain, discharge and redness.  Respiratory: Positive for shortness of breath (mild, on exertion). Negative for apnea, cough, choking, chest tightness, wheezing and stridor.    Cardiovascular: Positive for leg swelling. Negative for chest pain and palpitations.  Gastrointestinal: Negative for abdominal distention, abdominal pain, blood in stool, constipation, diarrhea, nausea and vomiting.  Endocrine: Negative for cold intolerance, heat intolerance, polydipsia, polyphagia and polyuria.  Genitourinary: Negative for difficulty urinating, dysuria, enuresis, flank pain, frequency, genital sores, hematuria and urgency.  Musculoskeletal: Negative for arthralgias and joint swelling.  Skin: Negative for color change, rash and wound.  Allergic/Immunologic: Negative for immunocompromised state.  Neurological: Negative for dizziness, tremors, seizures, syncope, facial asymmetry, speech difficulty, weakness, light-headedness, numbness and headaches.  Hematological: Does not bruise/bleed easily.  Psychiatric/Behavioral: Negative for agitation, behavioral problems, confusion, decreased concentration, dysphoric mood, hallucinations, sleep disturbance and suicidal ideas. The patient is not nervous/anxious and is not hyperactive.     Objective:  BP 135/85   Pulse 61   Temp 97.6 F (36.4 C) (Oral)   Ht 6' 1"  (1.854 m)   Wt 283 lb (128.4 kg)   BMI 37.34 kg/m   Physical Exam  Constitutional: He is oriented to person, place, and time. He appears well-developed and well-nourished.  HENT:  Head: Normocephalic and atraumatic.  Mouth/Throat: Oropharynx is clear and moist.  Eyes: EOM are normal. Pupils are equal, round, and reactive to light.  Neck: Normal range of motion. No tracheal deviation present. No thyromegaly present.  Cardiovascular: Normal rate, regular rhythm and normal heart sounds.  Exam reveals no gallop and no friction rub.   No murmur heard. Pulmonary/Chest: Breath sounds normal. He has  no wheezes. He has no rales.  Abdominal: Soft. He exhibits no mass. There is no tenderness.  Musculoskeletal: Normal range of motion. He exhibits no edema.  Neurological: He is  alert and oriented to person, place, and time.  Skin: Skin is warm and dry.  Psychiatric: He has a normal mood and affect.    Assessment & Plan:   James Solis was seen today for annual exam.  Diagnoses and all orders for this visit:  Well adult exam -     CBC with Differential/Platelet -     CMP14+EGFR -     Lipid panel -     PSA Total (Reflex To Free) -     Urinalysis, Complete  Edema leg -     CBC with Differential/Platelet -     CMP14+EGFR -     TSH -     Urinalysis, Complete -     Brain natriuretic peptide -     DG Chest 2 View; Future  Shortness of breath -     CBC with Differential/Platelet -     CMP14+EGFR -     TSH -     Brain natriuretic peptide -     D-dimer, quantitative (not at [facility]) -     DG Chest 2 View; Future -     ECHOCARDIOGRAM STRESS TEST; Future  Essential hypertension -     CBC with Differential/Platelet -     CMP14+EGFR -     TSH -     Urinalysis, Complete  Other orders -     Microscopic Examination   I have discontinued [name]'s fluocinonide ointment and ciprofloxacin. I am also having him maintain his aspirin, amLODipine, valsartan, metoprolol succinate, furosemide, terazosin, and chlorthalidone.  No orders of the defined types were placed in this encounter.    Follow-up: Return if symptoms worsen or fail to improve.  Claretta Fraise, M.D.

## 2017-03-12 LAB — CMP14+EGFR
ALT: 31 IU/L (ref 0–44)
AST: 23 IU/L (ref 0–40)
Albumin/Globulin Ratio: 1.9 (ref 1.2–2.2)
Albumin: 4.5 g/dL (ref 3.6–4.8)
Alkaline Phosphatase: 59 IU/L (ref 39–117)
BUN/Creatinine Ratio: 20 (ref 10–24)
BUN: 24 mg/dL (ref 8–27)
Bilirubin Total: 0.7 mg/dL (ref 0.0–1.2)
CO2: 27 mmol/L (ref 18–29)
Calcium: 9.3 mg/dL (ref 8.6–10.2)
Chloride: 98 mmol/L (ref 96–106)
Creatinine, Ser: 1.21 mg/dL (ref 0.76–1.27)
GFR calc Af Amer: 73 mL/min/{1.73_m2} (ref >59)
GFR calc non Af Amer: 63 mL/min/{1.73_m2} (ref >59)
Globulin, Total: 2.4 g/dL (ref 1.5–4.5)
Glucose: 95 mg/dL (ref 65–99)
Potassium: 3.6 mmol/L (ref 3.5–5.2)
Sodium: 141 mmol/L (ref 134–144)
Total Protein: 6.9 g/dL (ref 6.0–8.5)

## 2017-03-12 LAB — CBC WITH DIFFERENTIAL/PLATELET
Basophils Absolute: 0 10*3/uL (ref 0.0–0.2)
Basos: 1 %
EOS (ABSOLUTE): 0.1 10*3/uL (ref 0.0–0.4)
Eos: 2 %
Hematocrit: 45.1 % (ref 37.5–51.0)
Hemoglobin: 15.4 g/dL (ref 13.0–17.7)
Immature Grans (Abs): 0 10*3/uL (ref 0.0–0.1)
Immature Granulocytes: 0 %
Lymphocytes Absolute: 1.6 10*3/uL (ref 0.7–3.1)
Lymphs: 27 %
MCH: 29.3 pg (ref 26.6–33.0)
MCHC: 34.1 g/dL (ref 31.5–35.7)
MCV: 86 fL (ref 79–97)
Monocytes Absolute: 0.4 10*3/uL (ref 0.1–0.9)
Monocytes: 7 %
Neutrophils Absolute: 3.8 10*3/uL (ref 1.4–7.0)
Neutrophils: 63 %
Platelets: 189 10*3/uL (ref 150–379)
RBC: 5.26 x10E6/uL (ref 4.14–5.80)
RDW: 13.7 % (ref 12.3–15.4)
WBC: 6 10*3/uL (ref 3.4–10.8)

## 2017-03-12 LAB — LIPID PANEL
Chol/HDL Ratio: 4.5 ratio (ref 0.0–5.0)
Cholesterol, Total: 152 mg/dL (ref 100–199)
HDL: 34 mg/dL — ABNORMAL LOW (ref >39)
LDL Calculated: 82 mg/dL (ref 0–99)
Triglycerides: 180 mg/dL — ABNORMAL HIGH (ref 0–149)
VLDL Cholesterol Cal: 36 mg/dL (ref 5–40)

## 2017-03-12 LAB — TSH: TSH: 2.36 u[IU]/mL (ref 0.450–4.500)

## 2017-03-12 LAB — PSA TOTAL (REFLEX TO FREE): Prostate Specific Ag, Serum: 2.5 ng/mL (ref 0.0–4.0)

## 2017-03-12 LAB — BRAIN NATRIURETIC PEPTIDE: BNP: 26.2 pg/mL (ref 0.0–100.0)

## 2017-03-12 LAB — D-DIMER, QUANTITATIVE: D-DIMER: 0.52 mg/L FEU — ABNORMAL HIGH (ref 0.00–0.49)

## 2017-03-26 ENCOUNTER — Other Ambulatory Visit: Payer: Self-pay | Admitting: Family Medicine

## 2017-04-02 ENCOUNTER — Other Ambulatory Visit: Payer: Self-pay | Admitting: Family Medicine

## 2017-05-08 ENCOUNTER — Other Ambulatory Visit: Payer: Self-pay | Admitting: Family Medicine

## 2017-05-12 ENCOUNTER — Other Ambulatory Visit: Payer: Self-pay | Admitting: Family Medicine

## 2017-05-12 ENCOUNTER — Telehealth: Payer: Self-pay | Admitting: Family Medicine

## 2017-05-12 MED ORDER — OLMESARTAN MEDOXOMIL 40 MG PO TABS: 40.0000 mg | ORAL_TABLET | Freq: Every day | ORAL | 2 refills | Status: DC

## 2017-05-12 NOTE — Telephone Encounter (Signed)
Pt called regarding recent recall on Valsartan Please change to something else

## 2017-05-12 NOTE — Telephone Encounter (Signed)
I sent in the requested prescription 

## 2017-05-13 NOTE — Telephone Encounter (Signed)
Patient aware that Benicar has been sent to pharmacy.

## 2017-06-16 IMAGING — DX DG CHEST 2V
2 series · 2 of 2 positions shown · non-contrast
Comparison: None.

CLINICAL DATA: Shortness of breath.

EXAM:
CHEST  2 VIEW

[chest pa]
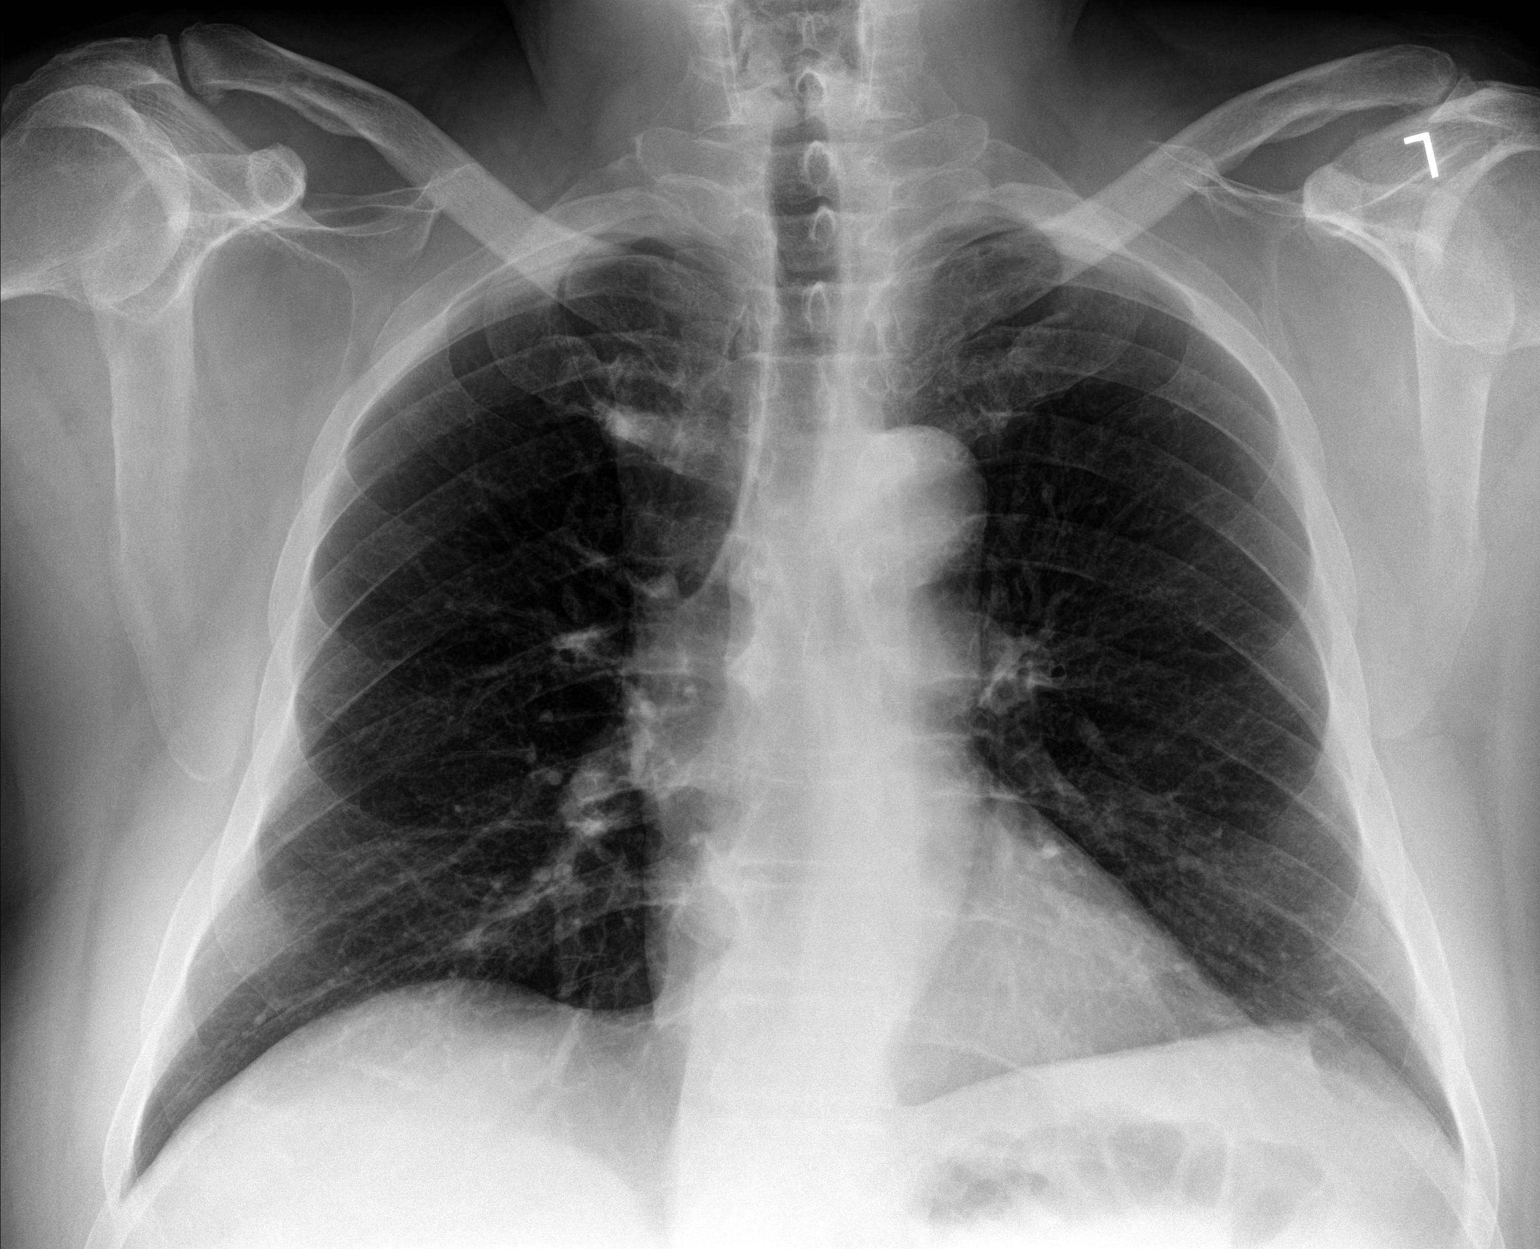

[chest lat]
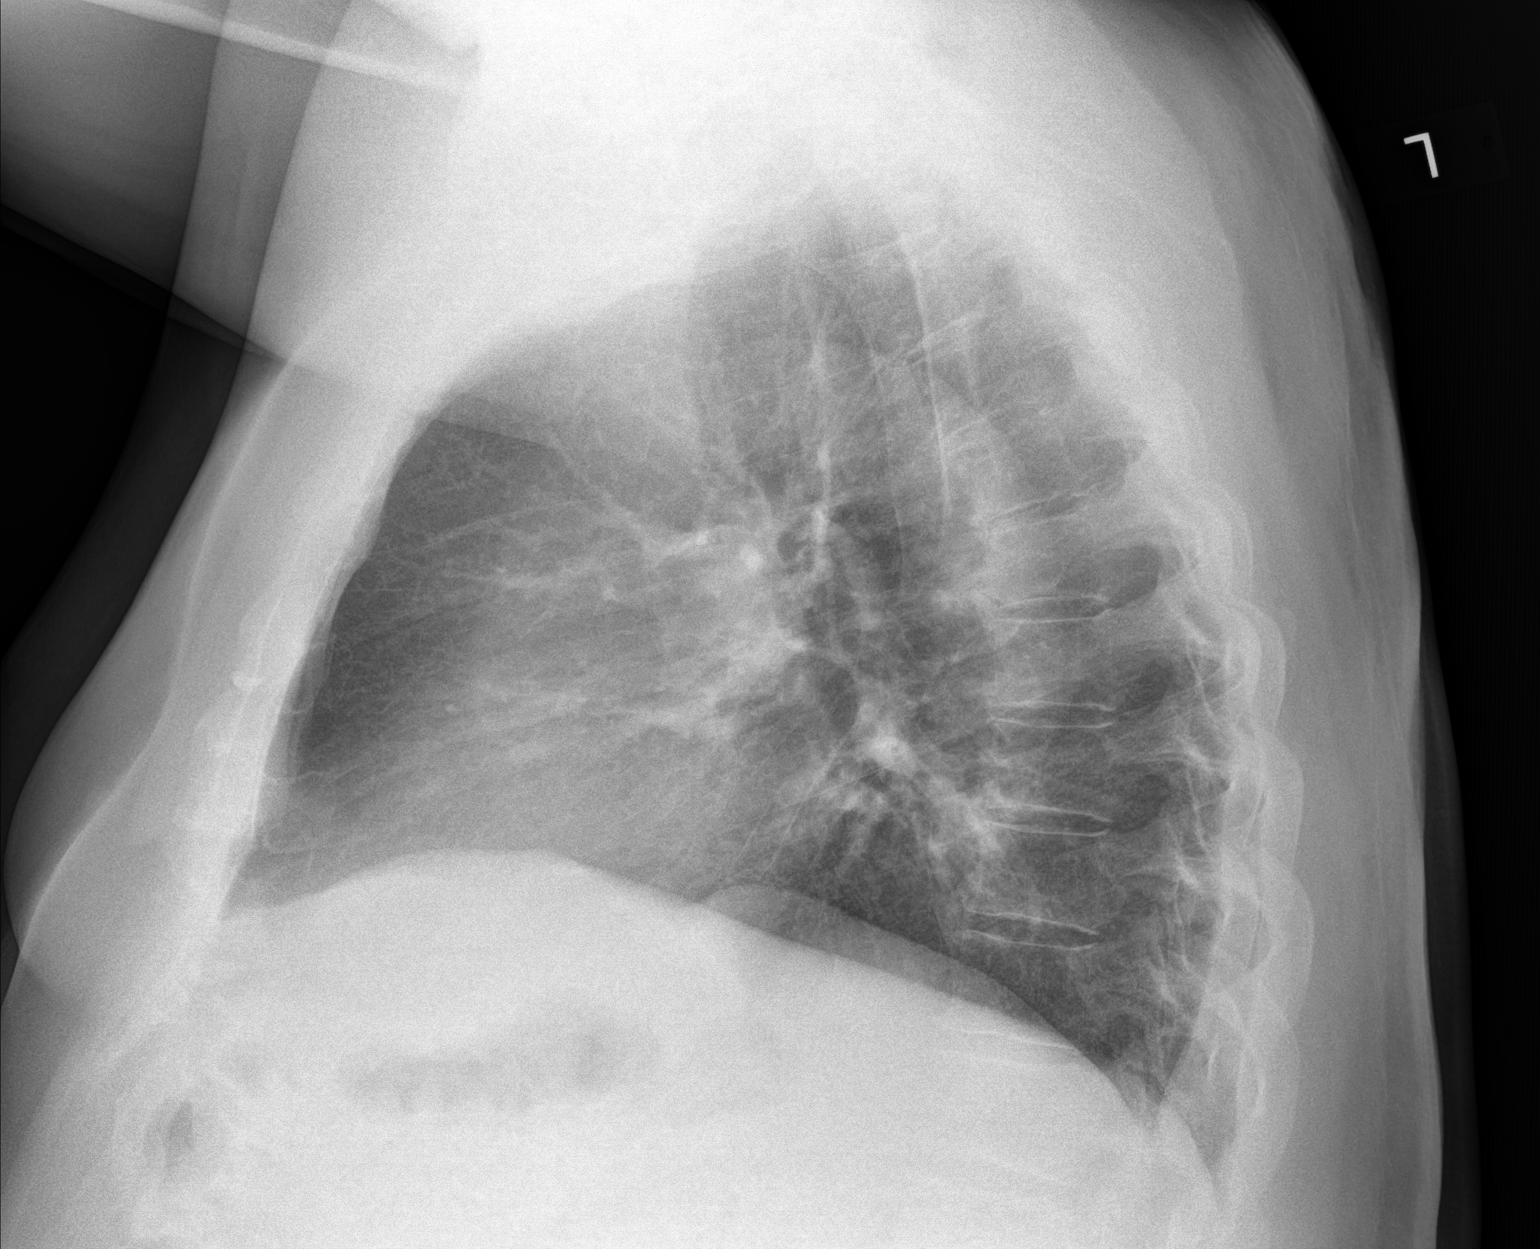

[2 of 2 positions shown; findings below may reference images not displayed]

FINDINGS: The cardiomediastinal silhouette is unremarkable.

There is no evidence of focal airspace disease, pulmonary edema,
suspicious pulmonary nodule/mass, pleural effusion, or pneumothorax.
No acute bony abnormalities are identified.
IMPRESSION: No active cardiopulmonary disease.

## 2017-06-18 ENCOUNTER — Telehealth: Payer: Self-pay | Admitting: Family Medicine

## 2017-06-18 MED ORDER — OLMESARTAN MEDOXOMIL 40 MG PO TABS: 40.0000 mg | ORAL_TABLET | Freq: Every day | ORAL | 2 refills | Status: DC

## 2017-06-18 NOTE — Telephone Encounter (Signed)
[facility] [place] [facility] Benicar since it had been over a month and the pt said he didn't think they would fill it.

## 2017-08-02 ENCOUNTER — Other Ambulatory Visit: Payer: Self-pay | Admitting: Family Medicine

## 2017-08-05 ENCOUNTER — Other Ambulatory Visit: Payer: Self-pay | Admitting: Family Medicine

## 2017-09-03 ENCOUNTER — Encounter: Payer: Self-pay | Admitting: Family

## 2017-09-03 ENCOUNTER — Ambulatory Visit (INDEPENDENT_AMBULATORY_CARE_PROVIDER_SITE_OTHER): Payer: BLUE CROSS/BLUE SHIELD | Admitting: Family

## 2017-09-03 ENCOUNTER — Other Ambulatory Visit: Payer: Self-pay | Admitting: Family Medicine

## 2017-09-03 VITALS — BP 138/85 | HR 58 | Temp 97.2°F | Ht 73.0 in | Wt 289.6 lb

## 2017-09-03 DIAGNOSIS — M109 Gout, unspecified: Secondary | ICD-10-CM | POA: Diagnosis not present

## 2017-09-03 MED ORDER — COLCHICINE 0.6 MG PO TABS: ORAL_TABLET | ORAL | 1 refills | Status: DC

## 2017-09-03 NOTE — Patient Instructions (Signed)

## 2017-09-03 NOTE — Progress Notes (Signed)
   Subjective:    Patient ID: James BenAlvin Holzheimer Jr., male    DOB: 09/19/1951, 66 y.o.   MRN: [id number]  Toe Pain   The incident occurred more than 1 week ago. There was no injury mechanism. Pain location: left great toe. The pain is at a severity of 8/10. The pain is moderate. The pain has been constant since onset. Pertinent negatives include no loss of motion, numbness or tingling. He reports no foreign bodies present. The symptoms are aggravated by movement and weight bearing. He has tried NSAIDs and rest for the symptoms. The treatment provided mild relief.      Review of Systems  Neurological: Negative for tingling and numbness.  All other systems reviewed and are negative.      Objective:   Physical Exam  Constitutional: He is oriented to person, place, and time. He appears well-developed and well-nourished. No distress.  HENT:  Head: Normocephalic.  Neck: Normal range of motion. Neck supple. No thyromegaly present.  Cardiovascular: Normal rate, regular rhythm, normal heart sounds and intact distal pulses.  No murmur heard. Pulmonary/Chest: Effort normal and breath sounds normal. No respiratory distress. He has no wheezes.  Abdominal: Soft. Bowel sounds are normal. He exhibits no distension. There is no tenderness.  Musculoskeletal: Normal range of motion. He exhibits edema and tenderness.  Left great toe tenderness and swelling  Neurological: He is alert and oriented to person, place, and time.  Skin: Skin is warm and dry. No rash noted. No erythema.  Psychiatric: He has a normal mood and affect. His behavior is normal. Judgment and thought content normal.  Vitals reviewed.    BP 138/85   Pulse (!) 58   Temp (!) 97.2 F (36.2 C) (Oral)   Ht 6\' 1"  (1.854 m)   Wt 289 lb 9.6 oz (131.4 kg)   BMI 38.21 kg/m      Assessment & Plan:  1. Acute gout involving toe of left foot, unspecified cause Low purine diet discussed Motrin prn  Will restart allopurinol in a few  weeks RTO prn and keep follow up with PCP   - colchicine 0.6 MG tablet; Take 1.2mg  and then one hour later take 0.6 mg for a max of 1.8mg   Dispense: 20 tablet; Refill: 1 - Uric acid    Jannifer Rodneyhristy Priscilla Finklea, FNP

## 2017-09-03 NOTE — Telephone Encounter (Signed)
What is the name of the medication? Allopurinol. He does not know the Mg he was on   Have you contacted your pharmacy to request a refill? Yes, they told him to call us because he has not been on it in awhile the doctor took him off  Which [facility]   Patient notified that their request is being sent to the clinical staff for review and that they should receive a call once it is complete. If they do not receive a call within 24 hours they can check with their pharmacy or our office.

## 2017-09-03 NOTE — Telephone Encounter (Signed)
Pt given appt today with [name] at 4:25.

## 2017-09-04 LAB — URIC ACID: Uric Acid: 9.1 mg/dL — ABNORMAL HIGH (ref 3.7–8.6)

## 2017-09-14 ENCOUNTER — Ambulatory Visit: Payer: BLUE CROSS/BLUE SHIELD | Admitting: Family Medicine

## 2017-09-25 ENCOUNTER — Other Ambulatory Visit: Payer: Self-pay | Admitting: Family Medicine

## 2017-10-21 ENCOUNTER — Telehealth: Payer: Self-pay | Admitting: Family

## 2017-10-21 ENCOUNTER — Other Ambulatory Visit: Payer: Self-pay | Admitting: *Deleted

## 2017-10-21 MED ORDER — ALLOPURINOL 300 MG PO TABS: ORAL_TABLET | ORAL | 1 refills | Status: DC

## 2017-10-21 NOTE — Telephone Encounter (Signed)
What is the name of the medication? Allopurinol  Have you contacted your pharmacy to request a refill? NO  [facility]   Patient notified that their request is being sent to the clinical staff for review and that they should receive a call once it is complete. If they do not receive a call within 24 hours they can check with their pharmacy or our office.

## 2017-11-06 ENCOUNTER — Other Ambulatory Visit: Payer: Self-pay | Admitting: Family Medicine

## 2017-12-20 ENCOUNTER — Other Ambulatory Visit: Payer: Self-pay | Admitting: Family Medicine

## 2018-01-01 ENCOUNTER — Other Ambulatory Visit: Payer: Self-pay | Admitting: Family Medicine

## 2018-01-07 ENCOUNTER — Telehealth: Payer: Self-pay | Admitting: *Deleted

## 2018-01-07 NOTE — Telephone Encounter (Signed)
[facility]ax received [facility] requested Olmesartan 40 mg tab on backorder Please advise 

## 2018-01-11 MED ORDER — OLMESARTAN MEDOXOMIL 20 MG PO TABS: 40.0000 mg | ORAL_TABLET | Freq: Every day | ORAL | 3 refills | Status: DC

## 2018-01-11 NOTE — Telephone Encounter (Signed)
Aware. 

## 2018-01-11 NOTE — Telephone Encounter (Signed)
Benicar 40 mg changed to Benicar 20 mg (take 2 tabs). Prescription sent to pharmacy

## 2018-01-25 ENCOUNTER — Telehealth: Payer: Self-pay | Admitting: *Deleted

## 2018-01-25 NOTE — Telephone Encounter (Signed)
Pt is on benicar 40, it is on back order, please replace.

## 2018-01-26 MED ORDER — OLMESARTAN MEDOXOMIL 40 MG PO TABS: 40.0000 mg | ORAL_TABLET | Freq: Every day | ORAL | 2 refills | Status: DC

## 2018-01-26 NOTE — Telephone Encounter (Signed)
I had changed this to Benicar 20 mg tablets and told to take 2 tabs. Is the Benicar 20 mg & 40 mg on backorder? If so I will switch.

## 2018-01-26 NOTE — Addendum Note (Signed)
Addended by: Jannifer RodneyHAWKS, Cleve Paolillo A on: 01/26/2018 10:40 AM   Modules accepted: Orders

## 2018-01-26 NOTE — Addendum Note (Signed)
Addended by: Magdalene RiverBULLINS, Granvil Djordjevic H on: 01/26/2018 10:53 AM   Modules accepted: Orders

## 2018-01-26 NOTE — Telephone Encounter (Signed)
[facility] --- the 20 mg is on back order.  New rx for 40 mg sent over

## 2018-02-05 ENCOUNTER — Other Ambulatory Visit: Payer: Self-pay

## 2018-02-05 ENCOUNTER — Other Ambulatory Visit: Payer: Self-pay | Admitting: Family Medicine

## 2018-02-05 MED ORDER — CHLORTHALIDONE 25 MG PO TABS: 25.0000 mg | ORAL_TABLET | Freq: Every day | ORAL | 0 refills | Status: DC

## 2018-02-05 NOTE — Telephone Encounter (Signed)
[facility]03/18  [facility]

## 2018-02-10 ENCOUNTER — Other Ambulatory Visit: Payer: Self-pay | Admitting: Family

## 2018-02-10 NOTE — Telephone Encounter (Signed)
Last seen 09/03/17  [facility]

## 2018-03-24 ENCOUNTER — Other Ambulatory Visit: Payer: Self-pay | Admitting: Family

## 2018-04-18 ENCOUNTER — Other Ambulatory Visit: Payer: Self-pay | Admitting: Family

## 2018-04-19 NOTE — Telephone Encounter (Signed)
Last seen 11/18 

## 2018-04-28 ENCOUNTER — Other Ambulatory Visit: Payer: Self-pay | Admitting: Family

## 2018-04-30 NOTE — Telephone Encounter (Signed)
Last [facility] 09/03/17  [facility]

## 2018-04-30 NOTE — Telephone Encounter (Signed)
Patient NTBS for follow up and lab work  

## 2018-05-13 ENCOUNTER — Other Ambulatory Visit: Payer: Self-pay | Admitting: Family

## 2018-05-14 ENCOUNTER — Telehealth: Payer: Self-pay | Admitting: *Deleted

## 2018-05-14 NOTE — Telephone Encounter (Signed)
Informed pt refill was sent to pharmacy for 30 days on 04/30/18 Appt made for next Friday for CPE, he usually gets one a year

## 2018-05-14 NOTE — Telephone Encounter (Signed)
Last seen 09/13/17  [facility]

## 2018-05-21 ENCOUNTER — Ambulatory Visit (INDEPENDENT_AMBULATORY_CARE_PROVIDER_SITE_OTHER): Payer: BLUE CROSS/BLUE SHIELD | Admitting: Family

## 2018-05-21 ENCOUNTER — Encounter: Payer: Self-pay | Admitting: Family

## 2018-05-21 VITALS — BP 130/86 | HR 60 | Temp 97.4°F | Ht 73.0 in | Wt 272.0 lb

## 2018-05-21 DIAGNOSIS — M1A9XX0 Chronic gout, unspecified, without tophus (tophi): Secondary | ICD-10-CM

## 2018-05-21 DIAGNOSIS — Z0000 Encounter for general adult medical examination without abnormal findings: Secondary | ICD-10-CM | POA: Diagnosis not present

## 2018-05-21 DIAGNOSIS — Z23 Encounter for immunization: Secondary | ICD-10-CM

## 2018-05-21 DIAGNOSIS — E785 Hyperlipidemia, unspecified: Secondary | ICD-10-CM

## 2018-05-21 DIAGNOSIS — I10 Essential (primary) hypertension: Secondary | ICD-10-CM

## 2018-05-21 DIAGNOSIS — E669 Obesity, unspecified: Secondary | ICD-10-CM | POA: Insufficient documentation

## 2018-05-21 MED ORDER — OLMESARTAN MEDOXOMIL 40 MG PO TABS: 40.0000 mg | ORAL_TABLET | Freq: Every day | ORAL | 2 refills | Status: DC

## 2018-05-21 MED ORDER — TERAZOSIN HCL 10 MG PO CAPS: ORAL_CAPSULE | ORAL | 2 refills | Status: DC

## 2018-05-21 MED ORDER — AMLODIPINE BESYLATE 10 MG PO TABS: 10.0000 mg | ORAL_TABLET | Freq: Every day | ORAL | 2 refills | Status: DC

## 2018-05-21 MED ORDER — CHLORTHALIDONE 25 MG PO TABS: 25.0000 mg | ORAL_TABLET | Freq: Every day | ORAL | 2 refills | Status: DC

## 2018-05-21 MED ORDER — ALLOPURINOL 300 MG PO TABS: ORAL_TABLET | ORAL | 1 refills | Status: DC

## 2018-05-21 NOTE — Addendum Note (Signed)
Addended by: Almeta MonasSTONE, Tena Linebaugh M on: 05/21/2018 12:43 PM   Modules accepted: Orders

## 2018-05-21 NOTE — Progress Notes (Signed)
Subjective:    Patient ID: James Nap., male    DOB: Oct 17, 1951, 67 y.o.   MRN: [id number]  No chief complaint on file.  PT presents to the office today for CPE. States he is doing a weight loss program through work. He states he has lost 20 pounds.  Hyperlipidemia  This is a chronic problem. The current episode started more than 1 year ago. The problem is controlled. Recent lipid tests were reviewed and are normal. Exacerbating diseases include obesity. Pertinent negatives include no shortness of breath. Current antihyperlipidemic treatment includes diet change. The current treatment provides moderate improvement of lipids. Risk factors for coronary artery disease include dyslipidemia, male sex, hypertension and obesity.  Hypertension  This is a chronic problem. The current episode started more than 1 year ago. The problem has been resolved since onset. The problem is controlled. Pertinent negatives include no headaches, peripheral edema or shortness of breath. Risk factors for coronary artery disease include dyslipidemia, male gender, obesity and family history. The current treatment provides moderate improvement. There is no history of kidney disease, CAD/MI, CVA or heart failure.  Gout Taking allopurinol daily. States his last gout flare up was years ago.    Review of Systems  Respiratory: Negative for shortness of breath.   Neurological: Negative for headaches.  All other systems reviewed and are negative.      Objective:   Physical Exam  Constitutional: He is oriented to person, place, and time. He appears well-developed and well-nourished. No distress.  HENT:  Head: Normocephalic.  Right Ear: External ear normal.  Left Ear: External ear normal.  Mouth/Throat: Oropharynx is clear and moist.  Eyes: Pupils are equal, round, and reactive to light. Right eye exhibits no discharge. Left eye exhibits no discharge.  Neck: Normal range of motion. Neck supple. No thyromegaly  present.  Cardiovascular: Normal rate, regular rhythm, normal heart sounds and intact distal pulses.  No murmur heard. Pulmonary/Chest: Effort normal and breath sounds normal. No respiratory distress. He has no wheezes.  Abdominal: Soft. Bowel sounds are normal. He exhibits no distension. There is no tenderness.  Musculoskeletal: Normal range of motion. He exhibits no edema or tenderness.  Neurological: He is alert and oriented to person, place, and time. He has normal reflexes. No cranial nerve deficit.  Skin: Skin is warm and dry. No rash noted. No erythema.  Psychiatric: He has a normal mood and affect. His behavior is normal. Judgment and thought content normal.  Vitals reviewed.     BP 130/86   Pulse 60   Temp (!) 97.4 F (36.3 C) (Oral)   Ht 6' 1"  (1.854 m)   Wt 272 lb (123.4 kg)   BMI 35.89 kg/m      Assessment & Plan:  James Nap. comes in today with chief complaint of Annual Exam   Diagnosis and orders addressed:  1. Essential hypertension - chlorthalidone (HYGROTON) 25 MG tablet; Take 1 tablet (25 mg total) by mouth daily.  Dispense: 90 tablet; Refill: 2 - amLODipine (NORVASC) 10 MG tablet; Take 1 tablet (10 mg total) by mouth daily.  Dispense: 90 tablet; Refill: 2 - olmesartan (BENICAR) 40 MG tablet; Take 1 tablet (40 mg total) by mouth daily.  Dispense: 90 tablet; Refill: 2 - terazosin (HYTRIN) 10 MG capsule; TAKE 1 CAPSULE BY MOUTH EVERYDAY AT BEDTIME  Dispense: 90 capsule; Refill: 2 - CMP14+EGFR - CBC with Differential/Platelet  2. Hyperlipidemia, unspecified hyperlipidemia type - CMP14+EGFR - CBC with Differential/Platelet - Lipid panel  3. Chronic gout without tophus, unspecified cause, unspecified site - allopurinol (ZYLOPRIM) 300 MG tablet; TAKE 1 TABLET BY MOUTH EVERY DAY  Dispense: 90 tablet; Refill: 1 - CMP14+EGFR - CBC with Differential/Platelet  4. Morbid obesity ([place]) - CMP14+EGFR - CBC with Differential/Platelet  5. Annual physical  exam - CMP14+EGFR - CBC with Differential/Platelet - Lipid panel - TSH - PSA, total and free   Labs pending Health Maintenance reviewed-TDAP given Diet and exercise encouraged  Follow up plan: 1 year    James Dun, FNP

## 2018-05-21 NOTE — Patient Instructions (Signed)

## 2018-05-22 LAB — CBC WITH DIFFERENTIAL/PLATELET
Basophils Absolute: 0 10*3/uL (ref 0.0–0.2)
Basos: 0 %
EOS (ABSOLUTE): 0.1 10*3/uL (ref 0.0–0.4)
Eos: 2 %
Hematocrit: 48 % (ref 37.5–51.0)
Hemoglobin: 16 g/dL (ref 13.0–17.7)
Immature Grans (Abs): 0 10*3/uL (ref 0.0–0.1)
Immature Granulocytes: 0 %
Lymphocytes Absolute: 1.6 10*3/uL (ref 0.7–3.1)
Lymphs: 22 %
MCH: 29 pg (ref 26.6–33.0)
MCHC: 33.3 g/dL (ref 31.5–35.7)
MCV: 87 fL (ref 79–97)
Monocytes Absolute: 0.5 10*3/uL (ref 0.1–0.9)
Monocytes: 7 %
Neutrophils Absolute: 5 10*3/uL (ref 1.4–7.0)
Neutrophils: 69 %
Platelets: 193 10*3/uL (ref 150–450)
RBC: 5.51 x10E6/uL (ref 4.14–5.80)
RDW: 13.4 % (ref 12.3–15.4)
WBC: 7.2 10*3/uL (ref 3.4–10.8)

## 2018-05-22 LAB — CMP14+EGFR
ALT: 25 IU/L (ref 0–44)
AST: 23 IU/L (ref 0–40)
Albumin/Globulin Ratio: 1.9 (ref 1.2–2.2)
Albumin: 4.6 g/dL (ref 3.6–4.8)
Alkaline Phosphatase: 59 IU/L (ref 39–117)
BUN/Creatinine Ratio: 17 (ref 10–24)
BUN: 20 mg/dL (ref 8–27)
Bilirubin Total: 0.7 mg/dL (ref 0.0–1.2)
CO2: 26 mmol/L (ref 20–29)
Calcium: 9.5 mg/dL (ref 8.6–10.2)
Chloride: 100 mmol/L (ref 96–106)
Creatinine, Ser: 1.17 mg/dL (ref 0.76–1.27)
GFR calc Af Amer: 76 mL/min/{1.73_m2} (ref >59)
GFR calc non Af Amer: 65 mL/min/{1.73_m2} (ref >59)
Globulin, Total: 2.4 g/dL (ref 1.5–4.5)
Glucose: 89 mg/dL (ref 65–99)
Potassium: 3.8 mmol/L (ref 3.5–5.2)
Sodium: 143 mmol/L (ref 134–144)
Total Protein: 7 g/dL (ref 6.0–8.5)

## 2018-05-22 LAB — LIPID PANEL
Chol/HDL Ratio: 4 ratio (ref 0.0–5.0)
Cholesterol, Total: 137 mg/dL (ref 100–199)
HDL: 34 mg/dL — ABNORMAL LOW (ref >39)
LDL Calculated: 76 mg/dL (ref 0–99)
Triglycerides: 137 mg/dL (ref 0–149)
VLDL Cholesterol Cal: 27 mg/dL (ref 5–40)

## 2018-05-22 LAB — TSH: TSH: 2.33 u[IU]/mL (ref 0.450–4.500)

## 2018-05-22 LAB — PSA, TOTAL AND FREE
PSA, Free Pct: 31.9 %
PSA, Free: 0.86 ng/mL
Prostate Specific Ag, Serum: 2.7 ng/mL (ref 0.0–4.0)

## 2018-05-25 ENCOUNTER — Other Ambulatory Visit: Payer: Self-pay | Admitting: Family

## 2018-05-25 ENCOUNTER — Encounter: Payer: Self-pay | Admitting: Family

## 2018-05-25 DIAGNOSIS — E785 Hyperlipidemia, unspecified: Secondary | ICD-10-CM

## 2018-05-25 DIAGNOSIS — I10 Essential (primary) hypertension: Secondary | ICD-10-CM

## 2018-05-25 NOTE — Telephone Encounter (Signed)
Please review for treadmill  

## 2018-06-03 ENCOUNTER — Ambulatory Visit (INDEPENDENT_AMBULATORY_CARE_PROVIDER_SITE_OTHER): Payer: BLUE CROSS/BLUE SHIELD

## 2018-06-03 DIAGNOSIS — I10 Essential (primary) hypertension: Secondary | ICD-10-CM

## 2018-06-03 DIAGNOSIS — E785 Hyperlipidemia, unspecified: Secondary | ICD-10-CM | POA: Diagnosis not present

## 2018-06-09 LAB — EXERCISE TOLERANCE TEST
Estimated workload: 7.7 METS
Exercise duration (min): 6 min
Exercise duration (sec): 48 s
MPHR: 156 {beats}/min
Peak HR: 134 {beats}/min
Percent HR: 85 %
RPE: 7
Rest HR: 74 {beats}/min

## 2018-09-29 ENCOUNTER — Other Ambulatory Visit: Payer: Self-pay | Admitting: Family

## 2018-12-31 ENCOUNTER — Other Ambulatory Visit: Payer: Self-pay | Admitting: Family

## 2019-01-17 ENCOUNTER — Other Ambulatory Visit: Payer: Self-pay | Admitting: Family

## 2019-01-17 DIAGNOSIS — M1A9XX0 Chronic gout, unspecified, without tophus (tophi): Secondary | ICD-10-CM

## 2019-01-17 NOTE — Telephone Encounter (Signed)
Last seen 05/21/18

## 2019-01-27 ENCOUNTER — Other Ambulatory Visit: Payer: Self-pay | Admitting: Family

## 2019-03-08 ENCOUNTER — Other Ambulatory Visit: Payer: Self-pay | Admitting: Family

## 2019-03-08 DIAGNOSIS — I10 Essential (primary) hypertension: Secondary | ICD-10-CM

## 2019-03-08 NOTE — Telephone Encounter (Signed)
OV 05/21/18 rtc 1 yr

## 2019-03-29 ENCOUNTER — Other Ambulatory Visit: Payer: Self-pay | Admitting: Family

## 2019-03-29 DIAGNOSIS — M109 Gout, unspecified: Secondary | ICD-10-CM

## 2019-04-01 ENCOUNTER — Other Ambulatory Visit: Payer: Self-pay | Admitting: Family

## 2019-04-01 DIAGNOSIS — I10 Essential (primary) hypertension: Secondary | ICD-10-CM

## 2019-04-18 ENCOUNTER — Other Ambulatory Visit: Payer: Self-pay | Admitting: Family

## 2019-04-18 DIAGNOSIS — M1A9XX0 Chronic gout, unspecified, without tophus (tophi): Secondary | ICD-10-CM

## 2019-04-18 NOTE — Telephone Encounter (Signed)
[facility]. NTBS last Uric acid 2018. Last OV 05/21/18

## 2019-04-19 NOTE — Telephone Encounter (Signed)
Patient aware and appt made 

## 2019-04-25 ENCOUNTER — Other Ambulatory Visit: Payer: Self-pay | Admitting: Family

## 2019-04-26 DIAGNOSIS — D229 Melanocytic nevi, unspecified: Secondary | ICD-10-CM | POA: Diagnosis not present

## 2019-04-26 DIAGNOSIS — L281 Prurigo nodularis: Secondary | ICD-10-CM | POA: Diagnosis not present

## 2019-05-04 ENCOUNTER — Other Ambulatory Visit: Payer: Self-pay

## 2019-05-05 ENCOUNTER — Encounter: Payer: Self-pay | Admitting: Family

## 2019-05-05 ENCOUNTER — Ambulatory Visit (INDEPENDENT_AMBULATORY_CARE_PROVIDER_SITE_OTHER): Payer: BC Managed Care – PPO | Admitting: Family

## 2019-05-05 VITALS — BP 132/76 | HR 59 | Temp 97.2°F | Ht 73.0 in | Wt 272.0 lb

## 2019-05-05 DIAGNOSIS — Z0000 Encounter for general adult medical examination without abnormal findings: Secondary | ICD-10-CM | POA: Diagnosis not present

## 2019-05-05 DIAGNOSIS — E785 Hyperlipidemia, unspecified: Secondary | ICD-10-CM | POA: Diagnosis not present

## 2019-05-05 DIAGNOSIS — I10 Essential (primary) hypertension: Secondary | ICD-10-CM

## 2019-05-05 DIAGNOSIS — Z23 Encounter for immunization: Secondary | ICD-10-CM | POA: Diagnosis not present

## 2019-05-05 DIAGNOSIS — Z0001 Encounter for general adult medical examination with abnormal findings: Secondary | ICD-10-CM | POA: Diagnosis not present

## 2019-05-05 DIAGNOSIS — M1A9XX0 Chronic gout, unspecified, without tophus (tophi): Secondary | ICD-10-CM | POA: Diagnosis not present

## 2019-05-05 NOTE — Addendum Note (Signed)
Addended by: Shelbie Ammons on: 05/05/2019 04:32 PM   Modules accepted: Orders

## 2019-05-05 NOTE — Patient Instructions (Signed)

## 2019-05-05 NOTE — Progress Notes (Signed)
Subjective:    Patient ID: James Nap., male    DOB: [birth year], 68 y.o.   MRN: [id number]  Chief Complaint  Patient presents with  . Medical Management of Chronic Issues   Pt presents to the office today for CPE.  Hypertension This is a chronic problem. The current episode started more than 1 year ago. The problem has been waxing and waning since onset. The problem is controlled. Pertinent negatives include no headaches, malaise/fatigue, peripheral edema or shortness of breath. Risk factors for coronary artery disease include obesity, male gender, sedentary lifestyle and family history. The current treatment provides moderate improvement. There is no history of kidney disease, CAD/MI or heart failure.  Hyperlipidemia This is a chronic problem. The current episode started more than 1 year ago. The problem is controlled. Recent lipid tests were reviewed and are normal. Exacerbating diseases include obesity. Pertinent negatives include no shortness of breath. Current antihyperlipidemic treatment includes statins. The current treatment provides moderate improvement of lipids. Risk factors for coronary artery disease include dyslipidemia, male sex, obesity and a sedentary lifestyle.   Gout PT taking allopurinol daily. States his last gout flare up was about 1 year ago.    Review of Systems  Constitutional: Negative for malaise/fatigue.  Respiratory: Negative for shortness of breath.   Neurological: Negative for headaches.  All other systems reviewed and are negative.      Objective:   Physical Exam Vitals signs reviewed.  Constitutional:      General: He is not in acute distress.    Appearance: He is well-developed. He is obese.  HENT:     Head: Normocephalic.     Right Ear: Tympanic membrane normal.     Left Ear: Tympanic membrane normal.  Eyes:     General:        Right eye: No discharge.        Left eye: No discharge.     Pupils: Pupils are equal, round, and reactive to  light.  Neck:     Musculoskeletal: Normal range of motion and neck supple.     Thyroid: No thyromegaly.  Cardiovascular:     Rate and Rhythm: Normal rate and regular rhythm.     Heart sounds: Normal heart sounds. No murmur.  Pulmonary:     Effort: Pulmonary effort is normal. No respiratory distress.     Breath sounds: Normal breath sounds. No wheezing.  Abdominal:     General: Bowel sounds are normal. There is no distension.     Palpations: Abdomen is soft.     Tenderness: There is no abdominal tenderness.  Musculoskeletal: Normal range of motion.        General: No tenderness.  Skin:    General: Skin is warm and dry.     Findings: No erythema or rash.  Neurological:     Mental Status: He is alert and oriented to person, place, and time.     Cranial Nerves: No cranial nerve deficit.     Deep Tendon Reflexes: Reflexes are normal and symmetric.  Psychiatric:        Behavior: Behavior normal.        Thought Content: Thought content normal.        Judgment: Judgment normal.       BP (!) 142/83   Pulse 60   Temp (!) 97.2 F (36.2 C) (Oral)   Ht 6' 1"  (1.854 m)   Wt 272 lb (123.4 kg)   BMI 35.89 kg/m  Assessment & Plan:  Robet Crutchfield. comes in today with chief complaint of Medical Management of Chronic Issues   Diagnosis and orders addressed:  1. Essential hypertension - CMP14+EGFR - CBC with Differential/Platelet  2. Chronic gout without tophus, unspecified cause, unspecified site - CMP14+EGFR - CBC with Differential/Platelet - Uric acid  3. Hyperlipidemia, unspecified hyperlipidemia type - CMP14+EGFR - CBC with Differential/Platelet - Lipid panel  4. Morbid obesity ([place]) - CMP14+EGFR - CBC with Differential/Platelet  5. Annual physical exam - CMP14+EGFR - CBC with Differential/Platelet - Lipid panel - PSA, total and free - TSH   Labs pending Health Maintenance reviewed Diet and exercise encouraged  Follow up plan: 6 months   [name], FNP

## 2019-05-06 ENCOUNTER — Other Ambulatory Visit: Payer: Self-pay | Admitting: Family

## 2019-05-06 MED ORDER — ATORVASTATIN CALCIUM 20 MG PO TABS: 20.0000 mg | ORAL_TABLET | Freq: Every day | ORAL | 3 refills | Status: DC

## 2019-05-08 ENCOUNTER — Other Ambulatory Visit: Payer: Self-pay | Admitting: Family

## 2019-05-08 DIAGNOSIS — M1A9XX0 Chronic gout, unspecified, without tophus (tophi): Secondary | ICD-10-CM

## 2019-05-10 LAB — CMP14+EGFR
ALT: 26 IU/L (ref 0–44)
AST: 20 IU/L (ref 0–40)
Albumin/Globulin Ratio: 2.2 (ref 1.2–2.2)
Albumin: 4.7 g/dL (ref 3.8–4.8)
Alkaline Phosphatase: 64 IU/L (ref 39–117)
BUN/Creatinine Ratio: 17 (ref 10–24)
BUN: 21 mg/dL (ref 8–27)
Bilirubin Total: 0.5 mg/dL (ref 0.0–1.2)
CO2: 28 mmol/L (ref 20–29)
Calcium: 9.7 mg/dL (ref 8.6–10.2)
Chloride: 98 mmol/L (ref 96–106)
Creatinine, Ser: 1.25 mg/dL (ref 0.76–1.27)
GFR calc Af Amer: 69 mL/min/{1.73_m2} (ref >59)
GFR calc non Af Amer: 60 mL/min/{1.73_m2} (ref >59)
Globulin, Total: 2.1 g/dL (ref 1.5–4.5)
Glucose: 80 mg/dL (ref 65–99)
Potassium: 3.6 mmol/L (ref 3.5–5.2)
Sodium: 141 mmol/L (ref 134–144)
Total Protein: 6.8 g/dL (ref 6.0–8.5)

## 2019-05-10 LAB — CBC WITH DIFFERENTIAL/PLATELET
Basophils Absolute: 0 10*3/uL (ref 0.0–0.2)
Basos: 1 %
EOS (ABSOLUTE): 0.2 10*3/uL (ref 0.0–0.4)
Eos: 2 %
Hematocrit: 48.2 % (ref 37.5–51.0)
Hemoglobin: 16.1 g/dL (ref 13.0–17.7)
Immature Grans (Abs): 0 10*3/uL (ref 0.0–0.1)
Immature Granulocytes: 0 %
Lymphocytes Absolute: 1.7 10*3/uL (ref 0.7–3.1)
Lymphs: 24 %
MCH: 29.8 pg (ref 26.6–33.0)
MCHC: 33.4 g/dL (ref 31.5–35.7)
MCV: 89 fL (ref 79–97)
Monocytes Absolute: 0.6 10*3/uL (ref 0.1–0.9)
Monocytes: 9 %
Neutrophils Absolute: 4.6 10*3/uL (ref 1.4–7.0)
Neutrophils: 64 %
Platelets: 190 10*3/uL (ref 150–450)
RBC: 5.4 x10E6/uL (ref 4.14–5.80)
RDW: 13.6 % (ref 11.6–15.4)
WBC: 7.1 10*3/uL (ref 3.4–10.8)

## 2019-05-10 LAB — PSA, TOTAL AND FREE
PSA, Free Pct: 34 %
PSA, Free: 0.85 ng/mL
Prostate Specific Ag, Serum: 2.5 ng/mL (ref 0.0–4.0)

## 2019-05-10 LAB — LIPID PANEL
Chol/HDL Ratio: 3.8 ratio (ref 0.0–5.0)
Cholesterol, Total: 143 mg/dL (ref 100–199)
HDL: 38 mg/dL — ABNORMAL LOW (ref >39)
LDL Calculated: 72 mg/dL (ref 0–99)
Triglycerides: 164 mg/dL — ABNORMAL HIGH (ref 0–149)
VLDL Cholesterol Cal: 33 mg/dL (ref 5–40)

## 2019-05-10 LAB — URIC ACID: Uric Acid: 5.5 mg/dL (ref 3.7–8.6)

## 2019-05-10 LAB — TSH: TSH: 2.42 u[IU]/mL (ref 0.450–4.500)

## 2019-05-12 ENCOUNTER — Telehealth: Payer: Self-pay | Admitting: Family

## 2019-05-12 NOTE — Telephone Encounter (Signed)
I am fine with him repeating, but would recommend waiting at least 3 months so insurance will pay. We are also starting the Lipitor because of his risk of a Cardiac event in the next 10-year was 14.4% We usually recommend starting statin greater than 7 %.   This is calculated by his age, sex, race, BP, HDL, and Total cholesterol .

## 2019-05-12 NOTE — Telephone Encounter (Signed)
Please advise and send back to [facility]    

## 2019-05-12 NOTE — Telephone Encounter (Signed)
Patient aware and verbalizes understanding. 

## 2019-05-22 ENCOUNTER — Other Ambulatory Visit: Payer: Self-pay | Admitting: Family

## 2019-05-25 ENCOUNTER — Other Ambulatory Visit: Payer: Self-pay | Admitting: Family

## 2019-05-25 DIAGNOSIS — I10 Essential (primary) hypertension: Secondary | ICD-10-CM

## 2019-06-05 ENCOUNTER — Other Ambulatory Visit: Payer: Self-pay | Admitting: Family

## 2019-06-05 DIAGNOSIS — I10 Essential (primary) hypertension: Secondary | ICD-10-CM

## 2019-06-10 ENCOUNTER — Other Ambulatory Visit: Payer: Self-pay | Admitting: Family

## 2019-06-10 DIAGNOSIS — I10 Essential (primary) hypertension: Secondary | ICD-10-CM

## 2019-07-03 ENCOUNTER — Other Ambulatory Visit: Payer: Self-pay | Admitting: Family

## 2019-07-03 DIAGNOSIS — I10 Essential (primary) hypertension: Secondary | ICD-10-CM

## 2019-10-19 DIAGNOSIS — L821 Other seborrheic keratosis: Secondary | ICD-10-CM | POA: Diagnosis not present

## 2019-10-19 DIAGNOSIS — D229 Melanocytic nevi, unspecified: Secondary | ICD-10-CM | POA: Diagnosis not present

## 2019-11-03 ENCOUNTER — Other Ambulatory Visit: Payer: Self-pay | Admitting: Family

## 2019-11-03 DIAGNOSIS — M1A9XX0 Chronic gout, unspecified, without tophus (tophi): Secondary | ICD-10-CM

## 2019-11-25 ENCOUNTER — Other Ambulatory Visit: Payer: Self-pay | Admitting: Family

## 2019-11-25 DIAGNOSIS — M1A9XX0 Chronic gout, unspecified, without tophus (tophi): Secondary | ICD-10-CM

## 2019-11-25 NOTE — Telephone Encounter (Signed)
[facility]. NTBS 30 days given 11/03/19

## 2019-11-25 NOTE — Telephone Encounter (Signed)
Patient aware and appt made 

## 2019-11-29 DIAGNOSIS — Z23 Encounter for immunization: Secondary | ICD-10-CM | POA: Diagnosis not present

## 2019-12-01 ENCOUNTER — Other Ambulatory Visit: Payer: Self-pay | Admitting: Family

## 2019-12-01 DIAGNOSIS — I10 Essential (primary) hypertension: Secondary | ICD-10-CM

## 2019-12-07 ENCOUNTER — Other Ambulatory Visit: Payer: Self-pay | Admitting: Family

## 2019-12-07 ENCOUNTER — Other Ambulatory Visit: Payer: Self-pay

## 2019-12-07 DIAGNOSIS — I10 Essential (primary) hypertension: Secondary | ICD-10-CM

## 2019-12-08 ENCOUNTER — Ambulatory Visit (INDEPENDENT_AMBULATORY_CARE_PROVIDER_SITE_OTHER): Payer: BC Managed Care – PPO | Admitting: Family

## 2019-12-08 ENCOUNTER — Encounter: Payer: Self-pay | Admitting: Family

## 2019-12-08 VITALS — BP 113/68 | HR 62 | Temp 98.2°F | Ht 73.0 in | Wt 287.8 lb

## 2019-12-08 DIAGNOSIS — E785 Hyperlipidemia, unspecified: Secondary | ICD-10-CM

## 2019-12-08 DIAGNOSIS — M1A9XX0 Chronic gout, unspecified, without tophus (tophi): Secondary | ICD-10-CM | POA: Diagnosis not present

## 2019-12-08 DIAGNOSIS — I10 Essential (primary) hypertension: Secondary | ICD-10-CM

## 2019-12-08 DIAGNOSIS — R040 Epistaxis: Secondary | ICD-10-CM

## 2019-12-08 DIAGNOSIS — Z0000 Encounter for general adult medical examination without abnormal findings: Secondary | ICD-10-CM | POA: Diagnosis not present

## 2019-12-08 MED ORDER — METOPROLOL SUCCINATE ER 50 MG PO TB24: ORAL_TABLET | ORAL | 0 refills | Status: DC

## 2019-12-08 MED ORDER — TERAZOSIN HCL 10 MG PO CAPS: 10.0000 mg | ORAL_CAPSULE | Freq: Every day | ORAL | 0 refills | Status: DC

## 2019-12-08 MED ORDER — OLMESARTAN MEDOXOMIL 40 MG PO TABS: 40.0000 mg | ORAL_TABLET | Freq: Every day | ORAL | 2 refills | Status: DC

## 2019-12-08 MED ORDER — ATORVASTATIN CALCIUM 20 MG PO TABS: 20.0000 mg | ORAL_TABLET | Freq: Every day | ORAL | 3 refills | Status: DC

## 2019-12-08 MED ORDER — ALLOPURINOL 300 MG PO TABS: ORAL_TABLET | ORAL | 1 refills | Status: DC

## 2019-12-08 MED ORDER — AMLODIPINE BESYLATE 10 MG PO TABS: 10.0000 mg | ORAL_TABLET | Freq: Every day | ORAL | 1 refills | Status: DC

## 2019-12-08 MED ORDER — CHLORTHALIDONE 25 MG PO TABS: 25.0000 mg | ORAL_TABLET | Freq: Every day | ORAL | 0 refills | Status: DC

## 2019-12-08 NOTE — Patient Instructions (Signed)
Gout  Gout is a condition that causes painful swelling of the joints. Gout is a type of inflammation of the joints (arthritis). This condition is caused by having too much uric acid in the body. Uric acid is a chemical that forms when the body breaks down substances called purines. Purines are important for building body proteins. When the body has too much uric acid, sharp crystals can form and build up inside the joints. This causes pain and swelling. Gout attacks can happen quickly and may be very painful (acute gout). Over time, the attacks can affect more joints and become more frequent (chronic gout). Gout can also cause uric acid to build up under the skin and inside the kidneys. What are the causes? This condition is caused by too much uric acid in your blood. This can happen because:  Your kidneys do not remove enough uric acid from your blood. This is the most common cause.  Your body makes too much uric acid. This can happen with some cancers and cancer treatments. It can also occur if your body is breaking down too many red blood cells (hemolytic anemia).  You eat too many foods that are high in purines. These foods include organ meats and some seafood. Alcohol, especially beer, is also high in purines. A gout attack may be triggered by trauma or stress. What increases the risk? You are more likely to develop this condition if you:  Have a family history of gout.  Are male and middle-aged.  Are male and have gone through menopause.  Are obese.  Frequently drink alcohol, especially beer.  Are dehydrated.  Lose weight too quickly.  Have an organ transplant.  Have lead poisoning.  Take certain medicines, including aspirin, cyclosporine, diuretics, levodopa, and niacin.  Have kidney disease.  Have a skin condition called psoriasis. What are the signs or symptoms? An attack of acute gout happens quickly. It usually occurs in just one joint. The most common place is  the big toe. Attacks often start at night. Other joints that may be affected include joints of the feet, ankle, knee, fingers, wrist, or elbow. Symptoms of this condition may include:  Severe pain.  Warmth.  Swelling.  Stiffness.  Tenderness. The affected joint may be very painful to touch.  Shiny, red, or purple skin.  Chills and fever. Chronic gout may cause symptoms more frequently. More joints may be involved. You may also have white or yellow lumps (tophi) on your hands or feet or in other areas near your joints. How is this diagnosed? This condition is diagnosed based on your symptoms, medical history, and physical exam. You may have tests, such as:  Blood tests to measure uric acid levels.  Removal of joint fluid with a thin needle (aspiration) to look for uric acid crystals.  X-rays to look for joint damage. How is this treated? Treatment for this condition has two phases: treating an acute attack and preventing future attacks. Acute gout treatment may include medicines to reduce pain and swelling, including:  NSAIDs.  Steroids. These are strong anti-inflammatory medicines that can be taken by mouth (orally) or injected into a joint.  Colchicine. This medicine relieves pain and swelling when it is taken soon after an attack. It can be given by mouth or through an IV. Preventive treatment may include:  Daily use of smaller doses of NSAIDs or colchicine.  Use of a medicine that reduces uric acid levels in your blood.  Changes to your diet. You may   need to see a dietitian about what to eat and drink to prevent gout. Follow these instructions at home: During a gout attack   If directed, put ice on the affected area: ? Put ice in a plastic bag. ? Place a towel between your skin and the bag. ? Leave the ice on for 20 minutes, 2-3 times a day.  Raise (elevate) the affected joint above the level of your heart as often as possible.  Rest the joint as much as possible.  If the affected joint is in your leg, you may be given crutches to use.  Follow instructions from your health care provider about eating or drinking restrictions. Avoiding future gout attacks  Follow a low-purine diet as told by your dietitian or health care provider. Avoid foods and drinks that are high in purines, including liver, kidney, anchovies, asparagus, herring, mushrooms, mussels, and beer.  Maintain a healthy weight or lose weight if you are overweight. If you want to lose weight, talk with your health care provider. It is important that you do not lose weight too quickly.  Start or maintain an exercise program as told by your health care provider. Eating and drinking  Drink enough fluids to keep your urine pale yellow.  If you drink alcohol: ? Limit how much you use to:  0-1 drink a day for women.  0-2 drinks a day for men. ? Be aware of how much alcohol is in your drink. In the [place] one drink equals one 12 oz bottle of beer (355 mL) one 5 oz glass of wine (148 mL), or one 1 oz glass of hard liquor (44 mL). General instructions  Take over-the-counter and prescription medicines only as told by your health care provider.  Do not drive or use heavy machinery while taking prescription pain medicine.  Return to your normal activities as told by your health care provider. Ask your health care provider what activities are safe for you.  Keep all follow-up visits as told by your health care provider. This is important. Contact a health care provider if you have:  Another gout attack.  Continuing symptoms of a gout attack after 10 days of treatment.  Side effects from your medicines.  Chills or a fever.  Burning pain when you urinate.  Pain in your lower back or belly. Get help right away if you:  Have severe or uncontrolled pain.  Cannot urinate. Summary  Gout is painful swelling of the joints caused by inflammation.  The most common site of pain is the big  toe, but it can affect other joints in the body.  Medicines and dietary changes can help to prevent and treat gout attacks. This information is not intended to replace advice given to you by your health care provider. Make sure you discuss any questions you have with your health care provider. Document Revised: 05/05/2018 Document Reviewed: 05/05/2018 Elsevier Patient Education  [place], Adult A nosebleed is when blood comes out of the nose. Nosebleeds are common. Usually, they are not a sign of a serious condition. Nosebleeds can happen if a small blood vessel in your nose starts to bleed or if the lining of your nose (mucous membrane) cracks. They are commonly caused by:  Allergies.  Colds.  Picking your nose.  Blowing your nose too hard.  An injury from sticking an object into your nose or getting hit in the nose.  Dry or cold air. Less common causes of nosebleeds include:  Toxic  fumes.  Something abnormal in the nose or in the air-filled spaces in the bones of the face (sinuses).  Growths in the nose, such as polyps.  Medicines or conditions that cause blood to clot slowly.  Certain illnesses or procedures that irritate or dry out the nasal passages. Follow these instructions at home: When you have a nosebleed:   Sit down and tilt your head slightly forward.  Use a clean towel or tissue to pinch your nostrils under the bony part of your nose. After 10 minutes, let go of your nose and see if bleeding starts again. Do not release pressure before that time. If there is still bleeding, repeat the pinching and holding for 10 minutes until the bleeding stops.  Do not place tissues or gauze in the nose to stop bleeding.  Avoid lying down and avoid tilting your head backward. That may make blood collect in the throat and cause gagging or coughing.  Use a nasal spray decongestant to help with a nosebleed as told by your health care provider.  Do not use  petroleum jelly or mineral oil in your nose. It can drip into your lungs. After a nosebleed:  Avoid blowing your nose or sniffing for a number of hours.  Avoid straining, lifting, or bending at the waist for several days. You may resume other normal activities as you are able.  Use saline spray or a humidifier as told by your health care provider.  Aspirinand blood thinners make bleeding more likely. If you are prescribed these medicines and you suffer from nosebleeds: ? Ask your health care provider if you should stop taking the medicines or if you should adjust the dose. ? Do not stop taking medicines that your health care provider has recommended unless told by your health care provider.  If your nosebleed was caused by dry mucous membranes, use over-the-counter saline nasal spray or gel. This will keep the mucous membranes moist and allow them to heal. If you must use a lubricant: ? Choose one that is water-soluble. ? Use only as much as you need and use it only as often as needed. ? Do not lie down until several hours after you use it. Contact a health care provider if:  You have a fever.  You get nosebleeds often or more often than usual.  You bruise very easily.  You have a nosebleed from having something stuck in your nose.  You have bleeding in your mouth.  You vomit or cough up brown material.  You have a nosebleed after you start a new medicine. Get help right away if:  You have a nosebleed after a fall or a head injury.  Your nosebleed does not go away after 20 minutes.  You feel dizzy or weak.  You have unusual bleeding from other parts of your body.  You have unusual bruising on other parts of your body.  You become sweaty.  You vomit blood. This information is not intended to replace advice given to you by your health care provider. Make sure you discuss any questions you have with your health care provider. Document Revised: 01/12/2018 Document  Reviewed: 04/29/2016 Elsevier Patient Education  2020 [employer].

## 2019-12-08 NOTE — Progress Notes (Signed)
Subjective:    Patient ID: James Nap., male    DOB: 11-11-1950, 69 y.o.   MRN: [id number]  Chief Complaint  Patient presents with  . Medical Management of Chronic Issues  . Epistaxis    Sunday night had bad nose bleed had to call EMS. Since then has done 4 more times    PT presents to the office today for CPE.  Epistaxis  The bleeding has been from the right nare. This is a new problem. The current episode started in the past 7 days. The problem has been gradually improving. The bleeding is associated with aspirin. He has tried pressure for the symptoms. The treatment provided no relief.  Hypertension This is a chronic problem. The current episode started more than 1 year ago. The problem has been resolved since onset. The problem is controlled. Pertinent negatives include no malaise/fatigue, peripheral edema or shortness of breath. Risk factors for coronary artery disease include dyslipidemia, obesity, male gender and sedentary lifestyle. The current treatment provides moderate improvement. There is no history of kidney disease or heart failure.  Hyperlipidemia This is a chronic problem. The current episode started more than 1 year ago. The problem is uncontrolled. Pertinent negatives include no shortness of breath. Current antihyperlipidemic treatment includes statins. The current treatment provides moderate improvement of lipids. Risk factors for coronary artery disease include dyslipidemia, male sex, hypertension and a sedentary lifestyle.   Gout Pt takes allopurinol daily. States his has gout flare up was years ago.     Review of Systems  Constitutional: Negative for malaise/fatigue.  HENT: Positive for nosebleeds.   Respiratory: Negative for shortness of breath.   All other systems reviewed and are negative.  . Family History  Problem Relation Age of Onset  . Asthma Mother   . Coronary artery disease Father        x3 Bypasses   . Hypertension Unknown   . Colon cancer  Neg Hx    Social History   Socioeconomic History  . Marital status: Married    Spouse name: Not on file  . Number of children: Not on file  . Years of education: Not on file  . Highest education level: Not on file  Occupational History  . Not on file  Tobacco Use  . Smoking status: Never Smoker  . Smokeless tobacco: Never Used  Substance and Sexual Activity  . Alcohol use: No    Alcohol/week: 0.0 standard drinks  . Drug use: No  . Sexual activity: Not on file  Other Topics Concern  . Not on file  Social History Narrative  . Not on file   Social Determinants of Health   Financial Resource Strain:   . Difficulty of Paying Living Expenses: Not on file  Food Insecurity:   . Worried About Charity fundraiser in the Last Year: Not on file  . Ran Out of Food in the Last Year: Not on file  Transportation Needs:   . Lack of Transportation (Medical): Not on file  . Lack of Transportation (Non-Medical): Not on file  Physical Activity:   . Days of Exercise per Week: Not on file  . Minutes of Exercise per Session: Not on file  Stress:   . Feeling of Stress : Not on file  Social Connections:   . Frequency of Communication with Friends and Family: Not on file  . Frequency of Social Gatherings with Friends and Family: Not on file  . Attends Religious Services: Not on file  .  Active Member of Clubs or Organizations: Not on file  . Attends Archivist Meetings: Not on file  . Marital Status: Not on file       Objective:   Physical Exam Vitals reviewed.  Constitutional:      General: He is not in acute distress.    Appearance: He is well-developed. He is obese.  HENT:     Head: Normocephalic.     Right Ear: Tympanic membrane normal.     Left Ear: Tympanic membrane normal.  Eyes:     General:        Right eye: No discharge.        Left eye: No discharge.     Pupils: Pupils are equal, round, and reactive to light.  Neck:     Thyroid: No thyromegaly.    Cardiovascular:     Rate and Rhythm: Normal rate and regular rhythm.     Heart sounds: Normal heart sounds. No murmur.  Pulmonary:     Effort: Pulmonary effort is normal. No respiratory distress.     Breath sounds: Normal breath sounds. No wheezing.  Abdominal:     General: Bowel sounds are normal. There is no distension.     Palpations: Abdomen is soft.     Tenderness: There is no abdominal tenderness.  Musculoskeletal:        General: No tenderness. Normal range of motion.     Cervical back: Normal range of motion and neck supple.  Skin:    General: Skin is warm and dry.     Findings: No erythema or rash.  Neurological:     Mental Status: He is alert and oriented to person, place, and time.     Cranial Nerves: No cranial nerve deficit.     Deep Tendon Reflexes: Reflexes are normal and symmetric.  Psychiatric:        Behavior: Behavior normal.        Thought Content: Thought content normal.        Judgment: Judgment normal.       BP 113/68   Pulse 62   Temp 98.2 F (36.8 C) (Temporal)   Ht _0  (1.854 m)   Wt 287 lb 12.8 oz (130.5 kg)   SpO2 98%   BMI 37.97 kg/m      Assessment & Plan:  Gertrude Tarbet. comes in today with chief complaint of Medical Management of Chronic Issues and Epistaxis (Sunday night had bad nose bleed had to call EMS. Since then has done 4 more times )   Diagnosis and orders addressed:  1. Chronic gout without tophus, unspecified cause, unspecified site - allopurinol (ZYLOPRIM) 300 MG tablet; TAKE 1 TABLET BY MOUTH EVERY DAY (Needs to be seen before next refill)  Dispense: 90 tablet; Refill: 1 - CMP14+EGFR - CBC with Differential/Platelet  2. Essential hypertension - amLODipine (NORVASC) 10 MG tablet; Take 1 tablet (10 mg total) by mouth daily.  Dispense: 90 tablet; Refill: 1 - chlorthalidone (HYGROTON) 25 MG tablet; Take 1 tablet (25 mg total) by mouth daily. (Please make 1 yr July ckup)  Dispense: 90 tablet; Refill: 0 - olmesartan  (BENICAR) 40 MG tablet; Take 1 tablet (40 mg total) by mouth daily.  Dispense: 90 tablet; Refill: 2 - terazosin (HYTRIN) 10 MG capsule; Take 1 capsule (10 mg total) by mouth at bedtime.  Dispense: 90 capsule; Refill: 0 - metoprolol succinate (TOPROL-XL) 50 MG 24 hr tablet; Take with or immediately following a meal.  Dispense: 90  tablet; Refill: 0 - CMP14+EGFR - CBC with Differential/Platelet  3. Hyperlipidemia, unspecified hyperlipidemia type - atorvastatin (LIPITOR) 20 MG tablet; Take 1 tablet (20 mg total) by mouth daily.  Dispense: 90 tablet; Refill: 3 - CMP14+EGFR - CBC with Differential/Platelet - Lipid panel  4. Morbid obesity ([place]) - CMP14+EGFR - CBC with Differential/Platelet  5. Annual physical exam - CMP14+EGFR - CBC with Differential/Platelet - Lipid panel - TSH - PSA, total and free  6. Epistaxis Saline gel Hold aspirin Avoid sneezing, coughing, or straining. If does not improve let me know and I will do a referral to ENT - CMP14+EGFR - CBC with Differential/Platelet   Labs pending Health Maintenance reviewed Diet and exercise encouraged  Follow up plan: 1 year   [name], FNP

## 2019-12-09 LAB — CMP14+EGFR
ALT: 42 IU/L (ref 0–44)
AST: 34 IU/L (ref 0–40)
Albumin/Globulin Ratio: 2.2 (ref 1.2–2.2)
Albumin: 4.4 g/dL (ref 3.8–4.8)
Alkaline Phosphatase: 72 IU/L (ref 39–117)
BUN/Creatinine Ratio: 13 (ref 10–24)
BUN: 18 mg/dL (ref 8–27)
Bilirubin Total: 0.5 mg/dL (ref 0.0–1.2)
CO2: 27 mmol/L (ref 20–29)
Calcium: 9 mg/dL (ref 8.6–10.2)
Chloride: 102 mmol/L (ref 96–106)
Creatinine, Ser: 1.37 mg/dL — ABNORMAL HIGH (ref 0.76–1.27)
GFR calc Af Amer: 62 mL/min/{1.73_m2} (ref >59)
GFR calc non Af Amer: 53 mL/min/{1.73_m2} — ABNORMAL LOW (ref >59)
Globulin, Total: 2 g/dL (ref 1.5–4.5)
Glucose: 103 mg/dL — ABNORMAL HIGH (ref 65–99)
Potassium: 3.4 mmol/L — ABNORMAL LOW (ref 3.5–5.2)
Sodium: 145 mmol/L — ABNORMAL HIGH (ref 134–144)
Total Protein: 6.4 g/dL (ref 6.0–8.5)

## 2019-12-09 LAB — CBC WITH DIFFERENTIAL/PLATELET
Basophils Absolute: 0 10*3/uL (ref 0.0–0.2)
Basos: 0 %
EOS (ABSOLUTE): 0.3 10*3/uL (ref 0.0–0.4)
Eos: 3 %
Hematocrit: 43.6 % (ref 37.5–51.0)
Hemoglobin: 15.1 g/dL (ref 13.0–17.7)
Immature Grans (Abs): 0 10*3/uL (ref 0.0–0.1)
Immature Granulocytes: 0 %
Lymphocytes Absolute: 1.7 10*3/uL (ref 0.7–3.1)
Lymphs: 23 %
MCH: 30.5 pg (ref 26.6–33.0)
MCHC: 34.6 g/dL (ref 31.5–35.7)
MCV: 88 fL (ref 79–97)
Monocytes Absolute: 0.6 10*3/uL (ref 0.1–0.9)
Monocytes: 8 %
Neutrophils Absolute: 4.8 10*3/uL (ref 1.4–7.0)
Neutrophils: 66 %
Platelets: 193 10*3/uL (ref 150–450)
RBC: 4.95 x10E6/uL (ref 4.14–5.80)
RDW: 13 % (ref 11.6–15.4)
WBC: 7.5 10*3/uL (ref 3.4–10.8)

## 2019-12-09 LAB — LIPID PANEL
Chol/HDL Ratio: 2.7 ratio (ref 0.0–5.0)
Cholesterol, Total: 90 mg/dL — ABNORMAL LOW (ref 100–199)
HDL: 33 mg/dL — ABNORMAL LOW (ref >39)
LDL Chol Calc (NIH): 29 mg/dL (ref 0–99)
Triglycerides: 168 mg/dL — ABNORMAL HIGH (ref 0–149)
VLDL Cholesterol Cal: 28 mg/dL (ref 5–40)

## 2019-12-09 LAB — PSA, TOTAL AND FREE
PSA, Free Pct: 36.1 %
PSA, Free: 0.83 ng/mL
Prostate Specific Ag, Serum: 2.3 ng/mL (ref 0.0–4.0)

## 2019-12-09 LAB — TSH: TSH: 2.19 u[IU]/mL (ref 0.450–4.500)

## 2019-12-20 ENCOUNTER — Other Ambulatory Visit: Payer: BC Managed Care – PPO

## 2019-12-20 ENCOUNTER — Other Ambulatory Visit: Payer: Self-pay | Admitting: Family Medicine

## 2019-12-20 ENCOUNTER — Other Ambulatory Visit: Payer: Self-pay

## 2019-12-20 DIAGNOSIS — I10 Essential (primary) hypertension: Secondary | ICD-10-CM | POA: Diagnosis not present

## 2019-12-21 LAB — SPECIMEN STATUS

## 2019-12-21 LAB — BMP8+EGFR
BUN/Creatinine Ratio: 21 (ref 10–24)
BUN: 23 mg/dL (ref 8–27)
CO2: 27 mmol/L (ref 20–29)
Calcium: 9 mg/dL (ref 8.6–10.2)
Chloride: 101 mmol/L (ref 96–106)
Creatinine, Ser: 1.11 mg/dL (ref 0.76–1.27)
GFR calc Af Amer: 80 mL/min/{1.73_m2} (ref >59)
GFR calc non Af Amer: 69 mL/min/{1.73_m2} (ref >59)
Glucose: 93 mg/dL (ref 65–99)
Potassium: 3.6 mmol/L (ref 3.5–5.2)
Sodium: 142 mmol/L (ref 134–144)

## 2019-12-21 LAB — SPECIMEN STATUS REPORT

## 2019-12-27 DIAGNOSIS — Z23 Encounter for immunization: Secondary | ICD-10-CM | POA: Diagnosis not present

## 2020-03-20 ENCOUNTER — Encounter: Payer: Self-pay | Admitting: Family Medicine

## 2020-03-20 ENCOUNTER — Ambulatory Visit (INDEPENDENT_AMBULATORY_CARE_PROVIDER_SITE_OTHER): Payer: BC Managed Care – PPO | Admitting: Family Medicine

## 2020-03-20 DIAGNOSIS — J069 Acute upper respiratory infection, unspecified: Secondary | ICD-10-CM | POA: Diagnosis not present

## 2020-03-20 NOTE — Progress Notes (Signed)
Virtual Visit via Telephone Note  I connected with James Solis. on 03/20/20 at 4:34 PM by telephone and verified that I am speaking with the correct person using two identifiers. James Solis. is currently located at work and nobody is currently with him during this visit. The provider, Loman Brooklyn, FNP is located in their office at time of visit.  I discussed the limitations, risks, security and privacy concerns of performing an evaluation and management service by telephone and the availability of in person appointments. I also discussed with the patient that there may be a patient responsible charge related to this service. The patient expressed understanding and agreed to proceed.  Subjective: PCP: Sharion Balloon, FNP  Chief Complaint  Patient presents with  . URI   Patient complains of cough, head congestion, runny nose and sneezing. Onset of symptoms was 5 days ago, gradually improving since that time. He is drinking plenty of fluids. Evaluation to date: none. Treatment to date: none. He does not smoke.    ROS: Per HPI  Current Outpatient Medications:  .  allopurinol (ZYLOPRIM) 300 MG tablet, TAKE 1 TABLET BY MOUTH EVERY DAY (Needs to be seen before next refill), Disp: 90 tablet, Rfl: 1 .  amLODipine (NORVASC) 10 MG tablet, Take 1 tablet (10 mg total) by mouth daily., Disp: 90 tablet, Rfl: 1 .  aspirin (LONGS ADULT LOW STRENGTH ASA) 81 MG EC tablet, Take 81 mg by mouth daily. , Disp: , Rfl:  .  atorvastatin (LIPITOR) 20 MG tablet, Take 1 tablet (20 mg total) by mouth daily., Disp: 90 tablet, Rfl: 3 .  chlorthalidone (HYGROTON) 25 MG tablet, Take 1 tablet (25 mg total) by mouth daily. (Please make 1 yr July ckup), Disp: 90 tablet, Rfl: 0 .  fluocinonide ointment (LIDEX) 0.05 %, APPLY TO AFFECTED AREA TWICE A DAY, Disp: 30 g, Rfl: 0 .  metoprolol succinate (TOPROL-XL) 50 MG 24 hr tablet, Take with or immediately following a meal., Disp: 90 tablet, Rfl: 0 .  olmesartan  (BENICAR) 40 MG tablet, Take 1 tablet (40 mg total) by mouth daily., Disp: 90 tablet, Rfl: 2 .  terazosin (HYTRIN) 10 MG capsule, Take 1 capsule (10 mg total) by mouth at bedtime., Disp: 90 capsule, Rfl: 0  No Known Allergies Past Medical History:  Diagnosis Date  . Cough   . Gout   . Hyperlipidemia   . Hypertension   . Nasal congestion   . Rash    bilateral legs   . Sinusitis     Observations/Objective: A&O  No respiratory distress or wheezing audible over the phone Mood, judgement, and thought processes all WNL  Assessment and Plan: 1. Viral URI - Discussed symptom management. Education provided on viral URIs.    Follow Up Instructions:  I discussed the assessment and treatment plan with the patient. The patient was provided an opportunity to ask questions and all were answered. The patient agreed with the plan and demonstrated an understanding of the instructions.   The patient was advised to call back or seek an in-person evaluation if the symptoms worsen or if the condition fails to improve as anticipated.  The above assessment and management plan was discussed with the patient. The patient verbalized understanding of and has agreed to the management plan. Patient is aware to call the clinic if symptoms persist or worsen. Patient is aware when to return to the clinic for a follow-up visit. Patient educated on when it is appropriate to go  to the emergency department.   Time call ended: 4:38 PM  I provided 6 minutes of non-face-to-face time during this encounter.  Deliah Boston, MSN, APRN, FNP-C [facility] 03/20/20

## 2020-03-20 NOTE — Patient Instructions (Signed)
Viral Respiratory Infection A viral respiratory infection is an illness that affects parts of the body that are used for breathing. These include the lungs, nose, and throat. It is caused by a germ called a virus. Some examples of this kind of infection are:  A cold.  The flu (influenza).  A respiratory syncytial virus (RSV) infection. A person who gets this illness may have the following symptoms:  A stuffy or runny nose.  Yellow or green fluid in the nose.  A cough.  Sneezing.  Tiredness (fatigue).  Achy muscles.  A sore throat.  Sweating or chills.  A fever.  A headache. Follow these instructions at home: Managing pain and congestion  Take over-the-counter and prescription medicines only as told by your doctor.  If you have a sore throat, gargle with salt water. Do this 3-4 times per day or as needed. To make a salt-water mixture, dissolve -1 tsp of salt in 1 cup of warm water. Make sure that all the salt dissolves.  Use nose drops made from salt water. This helps with stuffiness (congestion). It also helps soften the skin around your nose.  Drink enough fluid to keep your pee (urine) pale yellow. General instructions   Rest as much as possible.  Do not drink alcohol.  Do not use any products that have nicotine or tobacco, such as cigarettes and e-cigarettes. If you need help quitting, ask your doctor.  Keep all follow-up visits as told by your doctor. This is important. How is this prevented?   Get a flu shot every year. Ask your doctor when you should get your flu shot.  Do not let other people get your germs. If you are sick: ? Stay home from work or school. ? Wash your hands with soap and water often. Wash your hands after you cough or sneeze. If soap and water are not available, use hand sanitizer.  Avoid contact with people who are sick during cold and flu season. This is in fall and winter. Get help if:  Your symptoms last for 10 days or  longer.  Your symptoms get worse over time.  You have a fever.  You have very bad pain in your face or forehead.  Parts of your jaw or neck become very swollen. Get help right away if:  You feel pain or pressure in your chest.  You have shortness of breath.  You faint or feel like you will faint.  You keep throwing up (vomiting).  You feel confused. Summary  A viral respiratory infection is an illness that affects parts of the body that are used for breathing.  Examples of this illness include a cold, the flu, and respiratory syncytial virus (RSV) infection.  The infection can cause a runny nose, cough, sneezing, sore throat, and fever.  Follow what your doctor tells you about taking medicines, drinking lots of fluid, washing your hands, resting at home, and avoiding people who are sick. This information is not intended to replace advice given to you by your health care provider. Make sure you discuss any questions you have with your health care provider. Document Revised: 10/21/2018 Document Reviewed: 11/23/2017 Elsevier Patient Education  2020 [employer] Inc[employer]  

## 2020-04-04 ENCOUNTER — Encounter: Payer: Self-pay | Admitting: Family Medicine

## 2020-04-04 ENCOUNTER — Ambulatory Visit (INDEPENDENT_AMBULATORY_CARE_PROVIDER_SITE_OTHER): Payer: BC Managed Care – PPO | Admitting: Family Medicine

## 2020-04-04 DIAGNOSIS — R0981 Nasal congestion: Secondary | ICD-10-CM | POA: Diagnosis not present

## 2020-04-04 DIAGNOSIS — J302 Other seasonal allergic rhinitis: Secondary | ICD-10-CM | POA: Diagnosis not present

## 2020-04-04 MED ORDER — METHYLPREDNISOLONE 4 MG PO TBPK: ORAL_TABLET | ORAL | 0 refills | Status: DC

## 2020-04-04 MED ORDER — FLUTICASONE PROPIONATE 50 MCG/ACT NA SUSP: 2.0000 | Freq: Every day | NASAL | 6 refills | Status: DC

## 2020-04-04 NOTE — Progress Notes (Signed)
Virtual Visit via Telephone Note  I connected with James Solis. on 04/04/20 at 4:41 PM by telephone and verified that I am speaking with the correct person using two identifiers. James Ram. is currently located at work and nobody is currently with him during this visit. The provider, Gwenlyn Fudge, FNP is located in their office at time of visit.  I discussed the limitations, risks, security and privacy concerns of performing an evaluation and management service by telephone and the availability of in person appointments. I also discussed with the patient that there may be a patient responsible charge related to this service. The patient expressed understanding and agreed to proceed.  Subjective: PCP: James Spencer, FNP  Chief Complaint  Patient presents with  . URI   Patient complains of a dry cough and a "feather in throat". Additional symptoms include congestion. Onset of symptoms was 3 weeks ago, gradually improving since that time. He is drinking plenty of fluids. Evaluation to date: seen previously and thought to have a viral URI. Treatment to date: none.  He does not smoke.    ROS: Per HPI  Current Outpatient Medications:  .  allopurinol (ZYLOPRIM) 300 MG tablet, TAKE 1 TABLET BY MOUTH EVERY DAY (Needs to be seen before next refill), Disp: 90 tablet, Rfl: 1 .  amLODipine (NORVASC) 10 MG tablet, Take 1 tablet (10 mg total) by mouth daily., Disp: 90 tablet, Rfl: 1 .  aspirin (LONGS ADULT LOW STRENGTH ASA) 81 MG EC tablet, Take 81 mg by mouth daily. , Disp: , Rfl:  .  atorvastatin (LIPITOR) 20 MG tablet, Take 1 tablet (20 mg total) by mouth daily., Disp: 90 tablet, Rfl: 3 .  chlorthalidone (HYGROTON) 25 MG tablet, Take 1 tablet (25 mg total) by mouth daily. (Please make 1 yr July ckup), Disp: 90 tablet, Rfl: 0 .  fluocinonide ointment (LIDEX) 0.05 %, APPLY TO AFFECTED AREA TWICE A DAY, Disp: 30 g, Rfl: 0 .  metoprolol succinate (TOPROL-XL) 50 MG 24 hr tablet, Take with  or immediately following a meal., Disp: 90 tablet, Rfl: 0 .  olmesartan (BENICAR) 40 MG tablet, Take 1 tablet (40 mg total) by mouth daily., Disp: 90 tablet, Rfl: 2 .  terazosin (HYTRIN) 10 MG capsule, Take 1 capsule (10 mg total) by mouth at bedtime., Disp: 90 capsule, Rfl: 0  No Known Allergies Past Medical History:  Diagnosis Date  . Cough   . Gout   . Hyperlipidemia   . Hypertension   . Nasal congestion   . Rash    bilateral legs   . Sinusitis     Observations/Objective: A&O  No respiratory distress or wheezing audible over the phone Mood, judgement, and thought processes all WNL  Assessment and Plan: 1. Seasonal allergies - Encouraged to use Flonase in the a.m. and an antihistamine in the p.m. - fluticasone (FLONASE) 50 MCG/ACT nasal spray; Place 2 sprays into both nostrils daily.  Dispense: 16 g; Refill: 6  2. Head congestion - Steroid given to break up congestion. - methylPREDNISolone (MEDROL DOSEPAK) 4 MG TBPK tablet; Take as directed on package.  Dispense: 21 each; Refill: 0  Discussed with patient it does not sound like he has a bacterial infection and that I would like for him to try the above measures.  I did advise him that if this does not help to send me a MyChart message and I would give him an antibiotic since he is already had 2 visits.  Follow Up Instructions:  I discussed the assessment and treatment plan with the patient. The patient was provided an opportunity to ask questions and all were answered. The patient agreed with the plan and demonstrated an understanding of the instructions.   The patient was advised to call back or seek an in-person evaluation if the symptoms worsen or if the condition fails to improve as anticipated.  The above assessment and management plan was discussed with the patient. The patient verbalized understanding of and has agreed to the management plan. Patient is aware to call the clinic if symptoms persist or worsen. Patient  is aware when to return to the clinic for a follow-up visit. Patient educated on when it is appropriate to go to the emergency department.   Time call ended: 4:51 PM  I provided 12 minutes of non-face-to-face time during this encounter.  Hendricks Limes, MSN, APRN, FNP-C [facility] 04/04/20

## 2020-04-09 ENCOUNTER — Encounter: Payer: Self-pay | Admitting: Family Medicine

## 2020-05-17 ENCOUNTER — Other Ambulatory Visit: Payer: Self-pay | Admitting: Family

## 2020-05-17 DIAGNOSIS — I10 Essential (primary) hypertension: Secondary | ICD-10-CM

## 2020-05-17 NOTE — Telephone Encounter (Signed)
Last OV 12/08/19. Last RF 30 day supply given today. Next OV not scheduled ° °ntbs for further refills ° °

## 2020-05-22 DIAGNOSIS — R0781 Pleurodynia: Secondary | ICD-10-CM | POA: Diagnosis not present

## 2020-05-23 ENCOUNTER — Ambulatory Visit: Payer: BC Managed Care – PPO | Admitting: Family

## 2020-06-08 ENCOUNTER — Other Ambulatory Visit: Payer: Self-pay | Admitting: Family

## 2020-06-08 DIAGNOSIS — I10 Essential (primary) hypertension: Secondary | ICD-10-CM

## 2020-06-08 DIAGNOSIS — M1A9XX0 Chronic gout, unspecified, without tophus (tophi): Secondary | ICD-10-CM

## 2020-06-09 ENCOUNTER — Other Ambulatory Visit: Payer: Self-pay | Admitting: Family

## 2020-06-09 DIAGNOSIS — I10 Essential (primary) hypertension: Secondary | ICD-10-CM

## 2020-06-11 NOTE — Telephone Encounter (Signed)
OV 12/08/19 RTC 1 YR

## 2020-06-13 ENCOUNTER — Other Ambulatory Visit: Payer: Self-pay | Admitting: Family

## 2020-06-13 DIAGNOSIS — I10 Essential (primary) hypertension: Secondary | ICD-10-CM

## 2020-06-13 NOTE — Telephone Encounter (Signed)
[facility]. NTBS 30 days given 05/17/20

## 2020-06-14 NOTE — Telephone Encounter (Signed)
Appt scheduled

## 2020-06-15 ENCOUNTER — Other Ambulatory Visit: Payer: Self-pay

## 2020-06-15 ENCOUNTER — Ambulatory Visit (INDEPENDENT_AMBULATORY_CARE_PROVIDER_SITE_OTHER): Payer: BC Managed Care – PPO | Admitting: Family

## 2020-06-15 ENCOUNTER — Encounter: Payer: Self-pay | Admitting: Family

## 2020-06-15 VITALS — BP 127/72 | HR 63 | Temp 97.3°F | Ht 73.0 in | Wt 299.0 lb

## 2020-06-15 DIAGNOSIS — E785 Hyperlipidemia, unspecified: Secondary | ICD-10-CM | POA: Diagnosis not present

## 2020-06-15 DIAGNOSIS — K219 Gastro-esophageal reflux disease without esophagitis: Secondary | ICD-10-CM | POA: Diagnosis not present

## 2020-06-15 DIAGNOSIS — R059 Cough, unspecified: Secondary | ICD-10-CM

## 2020-06-15 DIAGNOSIS — I10 Essential (primary) hypertension: Secondary | ICD-10-CM

## 2020-06-15 DIAGNOSIS — Z23 Encounter for immunization: Secondary | ICD-10-CM

## 2020-06-15 DIAGNOSIS — J309 Allergic rhinitis, unspecified: Secondary | ICD-10-CM | POA: Insufficient documentation

## 2020-06-15 DIAGNOSIS — R05 Cough: Secondary | ICD-10-CM

## 2020-06-15 MED ORDER — OMEPRAZOLE 20 MG PO CPDR: 20.0000 mg | DELAYED_RELEASE_CAPSULE | Freq: Every day | ORAL | 3 refills | Status: DC

## 2020-06-15 NOTE — Patient Instructions (Signed)
Gastroesophageal Reflux Disease, Adult Gastroesophageal reflux (GER) happens when acid from the stomach flows up into the tube that connects the mouth and the stomach (esophagus). Normally, food travels down the esophagus and stays in the stomach to be digested. However, when a person has GER, food and stomach acid sometimes move back up into the esophagus. If this becomes a more serious problem, the person may be diagnosed with a disease called gastroesophageal reflux disease (GERD). GERD occurs when the reflux:  Happens often.  Causes frequent or severe symptoms.  Causes problems such as damage to the esophagus. When stomach acid comes in contact with the esophagus, the acid may cause soreness (inflammation) in the esophagus. Over time, GERD may create small holes (ulcers) in the lining of the esophagus. What are the causes? This condition is caused by a problem with the muscle between the esophagus and the stomach (lower esophageal sphincter, or LES). Normally, the LES muscle closes after food passes through the esophagus to the stomach. When the LES is weakened or abnormal, it does not close properly, and that allows food and stomach acid to go back up into the esophagus. The LES can be weakened by certain dietary substances, medicines, and medical conditions, including:  Tobacco use.  Pregnancy.  Having a hiatal hernia.  Alcohol use.  Certain foods and beverages, such as coffee, chocolate, onions, and peppermint. What increases the risk? You are more likely to develop this condition if you:  Have an increased body weight.  Have a connective tissue disorder.  Use NSAID medicines. What are the signs or symptoms? Symptoms of this condition include:  Heartburn.  Difficult or painful swallowing.  The feeling of having a lump in the throat.  Abitter taste in the mouth.  Bad breath.  Having a large amount of saliva.  Having an upset or bloated  stomach.  Belching.  Chest pain. Different conditions can cause chest pain. Make sure you see your health care provider if you experience chest pain.  Shortness of breath or wheezing.  Ongoing (chronic) cough or a night-time cough.  Wearing away of tooth enamel.  Weight loss. How is this diagnosed? Your health care provider will take a medical history and perform a physical exam. To determine if you have mild or severe GERD, your health care provider may also monitor how you respond to treatment. You may also have tests, including:  A test to examine your stomach and esophagus with a small camera (endoscopy).  A test thatmeasures the acidity level in your esophagus.  A test thatmeasures how much pressure is on your esophagus.  A barium swallow or modified barium swallow test to show the shape, size, and functioning of your esophagus. How is this treated? The goal of treatment is to help relieve your symptoms and to prevent complications. Treatment for this condition may vary depending on how severe your symptoms are. Your health care provider may recommend:  Changes to your diet.  Medicine.  Surgery. Follow these instructions at home: Eating and drinking   Follow a diet as recommended by your health care provider. This may involve avoiding foods and drinks such as: ? Coffee and tea (with or without caffeine). ? Drinks that containalcohol. ? Energy drinks and sports drinks. ? Carbonated drinks or sodas. ? Chocolate and cocoa. ? Peppermint and mint flavorings. ? Garlic and onions. ? Horseradish. ? Spicy and acidic foods, including peppers, chili powder, curry powder, vinegar, hot sauces, and barbecue sauce. ? Citrus fruit juices and citrus   fruits, such as oranges, lemons, and limes. ? Tomato-based foods, such as red sauce, chili, salsa, and pizza with red sauce. ? Fried and fatty foods, such as donuts, french fries, potato chips, and high-fat dressings. ? High-fat  meats, such as hot dogs and fatty cuts of red and white meats, such as rib eye steak, sausage, ham, and bacon. ? High-fat dairy items, such as whole milk, butter, and cream cheese.  Eat small, frequent meals instead of large meals.  Avoid drinking large amounts of liquid with your meals.  Avoid eating meals during the 2-3 hours before bedtime.  Avoid lying down right after you eat.  Do not exercise right after you eat. Lifestyle   Do not use any products that contain nicotine or tobacco, such as cigarettes, e-cigarettes, and chewing tobacco. If you need help quitting, ask your health care provider.  Try to reduce your stress by using methods such as yoga or meditation. If you need help reducing stress, ask your health care provider.  If you are overweight, reduce your weight to an amount that is healthy for you. Ask your health care provider for guidance about a safe weight loss goal. General instructions  Pay attention to any changes in your symptoms.  Take over-the-counter and prescription medicines only as told by your health care provider. Do not take aspirin, ibuprofen, or other NSAIDs unless your health care provider told you to do so.  Wear loose-fitting clothing. Do not wear anything tight around your waist that causes pressure on your abdomen.  Raise (elevate) the head of your bed about 6 inches (15 cm).  Avoid bending over if this makes your symptoms worse.  Keep all follow-up visits as told by your health care provider. This is important. Contact a health care provider if:  You have: ? New symptoms. ? Unexplained weight loss. ? Difficulty swallowing or it hurts to swallow. ? Wheezing or a persistent cough. ? A hoarse voice.  Your symptoms do not improve with treatment. Get help right away if you:  Have pain in your arms, neck, jaw, teeth, or back.  Feel sweaty, dizzy, or light-headed.  Have chest pain or shortness of breath.  Vomit and your vomit looks  like blood or coffee grounds.  Faint.  Have stool that is bloody or black.  Cannot swallow, drink, or eat. Summary  Gastroesophageal reflux happens when acid from the stomach flows up into the esophagus. GERD is a disease in which the reflux happens often, causes frequent or severe symptoms, or causes problems such as damage to the esophagus.  Treatment for this condition may vary depending on how severe your symptoms are. Your health care provider may recommend diet and lifestyle changes, medicine, or surgery.  Contact a health care provider if you have new or worsening symptoms.  Take over-the-counter and prescription medicines only as told by your health care provider. Do not take aspirin, ibuprofen, or other NSAIDs unless your health care provider told you to do so.  Keep all follow-up visits as told by your health care provider. This is important. This information is not intended to replace advice given to you by your health care provider. Make sure you discuss any questions you have with your health care provider. Document Revised: 04/21/2018 Document Reviewed: 04/21/2018 Elsevier Patient Education  2020 [employer]  

## 2020-06-15 NOTE — Progress Notes (Signed)
Subjective:    Patient ID: James Nap., male    DOB: [birth year], 69 y.o.   MRN: [id number]  Chief Complaint  Patient presents with  . Medical Management of Chronic Issues    not fasting    Pt presents to the office today for chronic follow up.  Hypertension This is a chronic problem. The current episode started more than 1 year ago. The problem has been resolved since onset. The problem is controlled. Pertinent negatives include no malaise/fatigue, peripheral edema or shortness of breath. Risk factors for coronary artery disease include obesity, male gender and sedentary lifestyle. The current treatment provides moderate improvement. There is no history of CVA or heart failure.  Hyperlipidemia This is a chronic problem. The current episode started more than 1 year ago. The problem is controlled. Recent lipid tests were reviewed and are normal. Pertinent negatives include no shortness of breath. Current antihyperlipidemic treatment includes diet change. The current treatment provides moderate improvement of lipids. Risk factors for coronary artery disease include dyslipidemia, male sex and hypertension.  Gastroesophageal Reflux He complains of coughing. He reports no belching, no early satiety or no heartburn. This is a new problem. The current episode started more than 1 month ago. Risk factors include obesity. He has tried nothing for the symptoms. The treatment provided mild relief.     Review of Systems  Constitutional: Negative for malaise/fatigue.  Respiratory: Positive for cough. Negative for shortness of breath.   Gastrointestinal: Negative for heartburn.  All other systems reviewed and are negative.      Objective:   Physical Exam Vitals reviewed.  Constitutional:      General: He is not in acute distress.    Appearance: He is well-developed. He is obese.  HENT:     Head: Normocephalic.     Right Ear: Tympanic membrane normal.     Left Ear: Tympanic membrane normal.      Nose:     Right Turbinates: Enlarged.     Left Turbinates: Enlarged.     Mouth/Throat:     Pharynx: Posterior oropharyngeal erythema present.  Eyes:     General:        Right eye: No discharge.        Left eye: No discharge.     Pupils: Pupils are equal, round, and reactive to light.  Neck:     Thyroid: No thyromegaly.  Cardiovascular:     Rate and Rhythm: Normal rate and regular rhythm.     Heart sounds: Normal heart sounds. No murmur heard.   Pulmonary:     Effort: Pulmonary effort is normal. No respiratory distress.     Breath sounds: Normal breath sounds. No wheezing.  Abdominal:     General: Bowel sounds are normal. There is no distension.     Palpations: Abdomen is soft.     Tenderness: There is no abdominal tenderness.  Musculoskeletal:        General: No tenderness. Normal range of motion.     Cervical back: Normal range of motion and neck supple.  Skin:    General: Skin is warm and dry.     Findings: No erythema or rash.  Neurological:     Mental Status: He is alert and oriented to person, place, and time.     Cranial Nerves: No cranial nerve deficit.     Deep Tendon Reflexes: Reflexes are normal and symmetric.  Psychiatric:        Behavior: Behavior normal.  Thought Content: Thought content normal.        Judgment: Judgment normal.       BP 127/72   Pulse 63   Temp (!) 97.3 F (36.3 C) (Temporal)   Ht 6' 1"  (1.854 m)   Wt 299 lb (135.6 kg)   SpO2 (!) 63%   BMI 39.45 kg/m      Assessment & Plan:  James Solis. comes in today with chief complaint of Medical Management of Chronic Issues (not fasting )   Diagnosis and orders addressed:  1. Essential hypertension - CMP14+EGFR  2. Morbid obesity ([place]) - CMP14+EGFR  3. Hyperlipidemia, unspecified hyperlipidemia type - CMP14+EGFR  4. Gastroesophageal reflux disease, unspecified whether esophagitis present - omeprazole (PRILOSEC) 20 MG capsule; Take 1 capsule (20 mg total) by mouth  daily.  Dispense: 30 capsule; Refill: 3 - CMP14+EGFR  5. Allergic rhinitis, unspecified seasonality, unspecified trigger  6. Cough   Labs pending Health Maintenance reviewed Diet and exercise encouraged  Follow up plan: 6 months    [name], FNP

## 2020-06-16 LAB — CMP14+EGFR
ALT: 31 IU/L (ref 0–44)
AST: 21 IU/L (ref 0–40)
Albumin/Globulin Ratio: 1.7 (ref 1.2–2.2)
Albumin: 4 g/dL (ref 3.8–4.8)
Alkaline Phosphatase: 82 IU/L (ref 48–121)
BUN/Creatinine Ratio: 14 (ref 10–24)
BUN: 17 mg/dL (ref 8–27)
Bilirubin Total: 0.7 mg/dL (ref 0.0–1.2)
CO2: 29 mmol/L (ref 20–29)
Calcium: 9.6 mg/dL (ref 8.6–10.2)
Chloride: 102 mmol/L (ref 96–106)
Creatinine, Ser: 1.23 mg/dL (ref 0.76–1.27)
GFR calc Af Amer: 70 mL/min/{1.73_m2} (ref >59)
GFR calc non Af Amer: 61 mL/min/{1.73_m2} (ref >59)
Globulin, Total: 2.4 g/dL (ref 1.5–4.5)
Glucose: 113 mg/dL — ABNORMAL HIGH (ref 65–99)
Potassium: 3.3 mmol/L — ABNORMAL LOW (ref 3.5–5.2)
Sodium: 142 mmol/L (ref 134–144)
Total Protein: 6.4 g/dL (ref 6.0–8.5)

## 2020-06-20 ENCOUNTER — Other Ambulatory Visit: Payer: Self-pay | Admitting: Family

## 2020-06-22 ENCOUNTER — Other Ambulatory Visit: Payer: Self-pay | Admitting: Family

## 2020-06-22 DIAGNOSIS — I10 Essential (primary) hypertension: Secondary | ICD-10-CM

## 2020-07-07 ENCOUNTER — Other Ambulatory Visit: Payer: Self-pay | Admitting: Family

## 2020-07-07 DIAGNOSIS — I10 Essential (primary) hypertension: Secondary | ICD-10-CM

## 2020-08-02 ENCOUNTER — Other Ambulatory Visit: Payer: Self-pay | Admitting: Family

## 2020-08-02 DIAGNOSIS — I10 Essential (primary) hypertension: Secondary | ICD-10-CM

## 2020-08-07 ENCOUNTER — Other Ambulatory Visit: Payer: Self-pay | Admitting: *Deleted

## 2020-08-07 DIAGNOSIS — I10 Essential (primary) hypertension: Secondary | ICD-10-CM

## 2020-08-07 MED ORDER — METOPROLOL SUCCINATE ER 50 MG PO TB24: ORAL_TABLET | ORAL | 1 refills | Status: DC

## 2020-08-29 DIAGNOSIS — Z23 Encounter for immunization: Secondary | ICD-10-CM | POA: Diagnosis not present

## 2020-09-05 ENCOUNTER — Other Ambulatory Visit: Payer: Self-pay | Admitting: Family

## 2020-09-05 DIAGNOSIS — M1A9XX0 Chronic gout, unspecified, without tophus (tophi): Secondary | ICD-10-CM

## 2020-09-05 DIAGNOSIS — I10 Essential (primary) hypertension: Secondary | ICD-10-CM

## 2020-09-06 ENCOUNTER — Ambulatory Visit: Payer: BC Managed Care – PPO | Admitting: Family Medicine

## 2020-09-10 ENCOUNTER — Other Ambulatory Visit: Payer: Self-pay | Admitting: Family

## 2020-09-10 DIAGNOSIS — K219 Gastro-esophageal reflux disease without esophagitis: Secondary | ICD-10-CM

## 2020-11-15 ENCOUNTER — Other Ambulatory Visit: Payer: Self-pay | Admitting: Family

## 2020-11-15 DIAGNOSIS — I10 Essential (primary) hypertension: Secondary | ICD-10-CM

## 2020-12-03 ENCOUNTER — Other Ambulatory Visit: Payer: Self-pay | Admitting: Family

## 2020-12-03 DIAGNOSIS — M1A9XX0 Chronic gout, unspecified, without tophus (tophi): Secondary | ICD-10-CM

## 2020-12-03 DIAGNOSIS — I10 Essential (primary) hypertension: Secondary | ICD-10-CM

## 2020-12-08 ENCOUNTER — Other Ambulatory Visit: Payer: Self-pay | Admitting: Family

## 2020-12-08 DIAGNOSIS — K219 Gastro-esophageal reflux disease without esophagitis: Secondary | ICD-10-CM

## 2020-12-10 ENCOUNTER — Other Ambulatory Visit: Payer: Self-pay | Admitting: Family

## 2020-12-10 DIAGNOSIS — I10 Essential (primary) hypertension: Secondary | ICD-10-CM

## 2020-12-13 ENCOUNTER — Telehealth: Payer: Self-pay | Admitting: *Deleted

## 2020-12-13 MED ORDER — LOSARTAN POTASSIUM 100 MG PO TABS: 100.0000 mg | ORAL_TABLET | Freq: Every day | ORAL | 3 refills | Status: DC

## 2020-12-13 NOTE — Telephone Encounter (Signed)
Benicar 40 mg changed to Losartan 100 mg. Please schedule a follow up appt with me in next 2-4 weeks to recheck.

## 2020-12-13 NOTE — Telephone Encounter (Signed)
Fax from [facility] [place] RE: Olmesartan 40 mg Note: paying $34 for Olmesartan. Pt requesting lower cost Alternative: Losartan 100 mg $4, Valsartan 320 mg $14, Irbesartan 300 mg $24 Last OV 06/15/20 Next OV not sched

## 2020-12-13 NOTE — Telephone Encounter (Signed)
Spoke to patient he states that he did not request a cheaper medication - he is fine with the medication that he is on at the moment.  Patient will look at what he has at home, see how much medication he has and will think about changing to the lower cost.  Patient will call back to discuss what he decides.

## 2020-12-13 NOTE — Addendum Note (Signed)
Addended by: Jannifer Rodney A on: 12/13/2020 01:00 PM   Modules accepted: Orders

## 2020-12-18 ENCOUNTER — Other Ambulatory Visit: Payer: Self-pay | Admitting: Family

## 2020-12-18 DIAGNOSIS — I10 Essential (primary) hypertension: Secondary | ICD-10-CM

## 2021-01-03 ENCOUNTER — Other Ambulatory Visit: Payer: Self-pay | Admitting: Family

## 2021-01-03 DIAGNOSIS — I10 Essential (primary) hypertension: Secondary | ICD-10-CM

## 2021-01-03 DIAGNOSIS — K219 Gastro-esophageal reflux disease without esophagitis: Secondary | ICD-10-CM

## 2021-01-03 DIAGNOSIS — M1A9XX0 Chronic gout, unspecified, without tophus (tophi): Secondary | ICD-10-CM

## 2021-01-03 NOTE — Telephone Encounter (Signed)
[employer] NTBS 30 days given 12/10/20 

## 2021-01-15 ENCOUNTER — Other Ambulatory Visit: Payer: Self-pay | Admitting: Family

## 2021-01-15 DIAGNOSIS — I10 Essential (primary) hypertension: Secondary | ICD-10-CM

## 2021-01-16 NOTE — Telephone Encounter (Signed)
[employer] NTBS 30 days given 12/18/20

## 2021-02-04 ENCOUNTER — Other Ambulatory Visit: Payer: Self-pay | Admitting: Family

## 2021-02-04 DIAGNOSIS — M1A9XX0 Chronic gout, unspecified, without tophus (tophi): Secondary | ICD-10-CM

## 2021-02-04 DIAGNOSIS — I10 Essential (primary) hypertension: Secondary | ICD-10-CM

## 2021-02-05 ENCOUNTER — Other Ambulatory Visit: Payer: Self-pay | Admitting: Family

## 2021-02-05 DIAGNOSIS — E785 Hyperlipidemia, unspecified: Secondary | ICD-10-CM

## 2021-02-07 ENCOUNTER — Other Ambulatory Visit: Payer: Self-pay

## 2021-02-07 ENCOUNTER — Ambulatory Visit (INDEPENDENT_AMBULATORY_CARE_PROVIDER_SITE_OTHER): Payer: BC Managed Care – PPO | Admitting: Family Medicine

## 2021-02-07 ENCOUNTER — Encounter: Payer: Self-pay | Admitting: Family Medicine

## 2021-02-07 VITALS — BP 141/83 | HR 58 | Temp 97.6°F | Ht 73.0 in | Wt 255.4 lb

## 2021-02-07 DIAGNOSIS — N401 Enlarged prostate with lower urinary tract symptoms: Secondary | ICD-10-CM

## 2021-02-07 DIAGNOSIS — M1A9XX0 Chronic gout, unspecified, without tophus (tophi): Secondary | ICD-10-CM

## 2021-02-07 DIAGNOSIS — R338 Other retention of urine: Secondary | ICD-10-CM

## 2021-02-07 LAB — URINALYSIS
Bilirubin, UA: NEGATIVE
Glucose, UA: NEGATIVE
Ketones, UA: NEGATIVE
Leukocytes,UA: NEGATIVE
Nitrite, UA: NEGATIVE
Protein,UA: NEGATIVE
RBC, UA: NEGATIVE
Specific Gravity, UA: 1.02 (ref 1.005–1.030)
Urobilinogen, Ur: 0.2 mg/dL (ref 0.2–1.0)
pH, UA: 5 (ref 5.0–7.5)

## 2021-02-07 MED ORDER — TERAZOSIN HCL 10 MG PO CAPS: 10.0000 mg | ORAL_CAPSULE | Freq: Every day | ORAL | 0 refills | Status: DC

## 2021-02-07 MED ORDER — ALLOPURINOL 300 MG PO TABS
300.0000 mg | ORAL_TABLET | Freq: Every day | ORAL | 0 refills | Status: DC
Start: 2021-02-07 — End: 2021-03-01

## 2021-02-07 NOTE — Patient Instructions (Signed)

## 2021-02-07 NOTE — Progress Notes (Signed)
Acute Office Visit  Subjective:    Patient ID: James Solis., male    DOB: Aug 01, 1951, 70 y.o.   MRN: [id number]  Chief Complaint  Patient presents with  . Urinary Retention    Tried urinating all night long, had trouble    HPI  Patient is in today for urinary retention. Al reports difficulty voiding all of last night. He has been able to void, he has just had difficulty doing so. He used to take terazosin but has been out for about 1 month. He denies dysuria, fever, abdominal pain, flank pain, nausea, or vomiting. Denies penile discharge or swelling.   He also needs a refill for his allopurinol. He has a follow up appointment scheduled with his PCP in a few weeks.   Past Medical History:  Diagnosis Date  . Cough   . Gout   . Hyperlipidemia   . Hypertension   . Nasal congestion   . Rash    bilateral legs   . Sinusitis     Past Surgical History:  Procedure Laterality Date  . COLONOSCOPY  2006   [facility], unknown md, normal  . INGUINAL HERNIA REPAIR  1997    Family History  Problem Relation Age of Onset  . Asthma Mother   . Coronary artery disease Father        x3 Bypasses   . Hypertension Other   . Colon cancer Neg Hx     Social History   Socioeconomic History  . Marital status: Married    Spouse name: Not on file  . Number of children: Not on file  . Years of education: Not on file  . Highest education level: Not on file  Occupational History  . Not on file  Tobacco Use  . Smoking status: Never Smoker  . Smokeless tobacco: Never Used  Vaping Use  . Vaping Use: Never used  Substance and Sexual Activity  . Alcohol use: No    Alcohol/week: 0.0 standard drinks  . Drug use: No  . Sexual activity: Not on file  Other Topics Concern  . Not on file  Social History Narrative  . Not on file   Social Determinants of Health   Financial Resource Strain: Not on file  Food Insecurity: Not on file  Transportation Needs: Not on file  Physical  Activity: Not on file  Stress: Not on file  Social Connections: Not on file  Intimate Partner Violence: Not on file    Outpatient Medications Prior to Visit  Medication Sig Dispense Refill  . allopurinol (ZYLOPRIM) 300 MG tablet TAKE 1 TABLET BY MOUTH EVERY DAY (NEEDS TO BE SEEN BEFORE NEXT REFILL) 30 tablet 0  . atorvastatin (LIPITOR) 20 MG tablet Take 1 tablet (20 mg total) by mouth daily. 90 tablet 3  . losartan (COZAAR) 100 MG tablet Take 1 tablet (100 mg total) by mouth daily. 90 tablet 3  . terazosin (HYTRIN) 10 MG capsule Take 1 capsule (10 mg total) by mouth at bedtime. (Needs to be seen before next refill) 30 capsule 0  . amLODipine (NORVASC) 10 MG tablet TAKE 1 TABLET (10 MG TOTAL) BY MOUTH DAILY. NEEDS TO BE SEEN BEFORE NEXT REFILL 30 tablet 0  . fluocinonide ointment (LIDEX) 0.05 % APPLY TO AFFECTED AREA TWICE A DAY (Patient not taking: Reported on 02/07/2021) 30 g 0  . metoprolol succinate (TOPROL-XL) 50 MG 24 hr tablet TAKE 1 TABLET BY MOUTH WITH OR IMMEDIATELY FOLLOWING A MEAL. (Patient not taking: Reported  on 02/07/2021) 90 tablet 1  . omeprazole (PRILOSEC) 20 MG capsule Take 1 capsule (20 mg total) by mouth daily. (Needs to be seen before next refill) 30 capsule 0   No facility-administered medications prior to visit.    No Known Allergies  Review of Systems As per HPI.     Objective:    Physical Exam Vitals and nursing note reviewed.  Constitutional:      General: He is not in acute distress.    Appearance: Normal appearance. He is not ill-appearing, toxic-appearing or diaphoretic.  HENT:     Head: Normocephalic and atraumatic.  Cardiovascular:     Rate and Rhythm: Normal rate and regular rhythm.     Heart sounds: Normal heart sounds. No murmur heard.   Pulmonary:     Effort: Pulmonary effort is normal. No respiratory distress.     Breath sounds: Normal breath sounds.  Abdominal:     Palpations: Abdomen is soft.     Tenderness: There is no abdominal  tenderness. There is no right CVA tenderness, left CVA tenderness, guarding or rebound.  Musculoskeletal:     Right lower leg: No edema.     Left lower leg: No edema.  Skin:    General: Skin is warm and dry.  Neurological:     General: No focal deficit present.     Mental Status: He is alert and oriented to person, place, and time.  Psychiatric:        Mood and Affect: Mood normal.        Behavior: Behavior normal.     BP (!) 141/83   Pulse (!) 58   Temp 97.6 F (36.4 C)   Ht 6\' 1"  (1.854 m)   Wt 255 lb 6.4 oz (115.8 kg)   SpO2 97%   BMI 33.70 kg/m  Wt Readings from Last 3 Encounters:  02/07/21 255 lb 6.4 oz (115.8 kg)  06/15/20 299 lb (135.6 kg)  12/08/19 287 lb 12.8 oz (130.5 kg)   Urine dipstick shows negative for all components.   Health Maintenance Due  Topic Date Due  . COVID-19 Vaccine (3 - Booster for Moderna series) 06/28/2020    There are no preventive care reminders to display for this patient.   Lab Results  Component Value Date   TSH 2.190 12/08/2019   Lab Results  Component Value Date   WBC WILL FOLLOW 12/20/2019   HGB WILL FOLLOW 12/20/2019   HCT WILL FOLLOW 12/20/2019   MCV WILL FOLLOW 12/20/2019   PLT WILL FOLLOW 12/20/2019   Lab Results  Component Value Date   NA 142 06/15/2020   K 3.3 (L) 06/15/2020   CO2 29 06/15/2020   GLUCOSE 113 (H) 06/15/2020   BUN 17 06/15/2020   CREATININE 1.23 06/15/2020   BILITOT 0.7 06/15/2020   ALKPHOS 82 06/15/2020   AST 21 06/15/2020   ALT 31 06/15/2020   PROT 6.4 06/15/2020   ALBUMIN 4.0 06/15/2020   CALCIUM 9.6 06/15/2020   Lab Results  Component Value Date   CHOL 90 (L) 12/08/2019   Lab Results  Component Value Date   HDL 33 (L) 12/08/2019   Lab Results  Component Value Date   LDLCALC 29 12/08/2019   Lab Results  Component Value Date   TRIG 168 (H) 12/08/2019   Lab Results  Component Value Date   CHOLHDL 2.7 12/08/2019   No results found for: HGBA1C     Assessment & Plan:    Hrithik was seen today  for urinary retention.  Diagnoses and all orders for this visit:  Urinary retention due to benign prostatic hyperplasia UA negative. Refill provided. Return to office for new or worsening symptoms, or if symptoms persist.  -     Urinalysis -     terazosin (HYTRIN) 10 MG capsule; Take 1 capsule (10 mg total) by mouth at bedtime. (Needs to be seen before next refill)  Chronic gout without tophus, unspecified cause, unspecified site Refill provided. -     allopurinol (ZYLOPRIM) 300 MG tablet; Take 1 tablet (300 mg total) by mouth daily.    Return for schedule appointment with [facility] for chronic follow up.  The patient indicates understanding of these issues and agrees with the plan.   Gabriel Earing, FNP

## 2021-03-01 ENCOUNTER — Other Ambulatory Visit: Payer: Self-pay | Admitting: Family Medicine

## 2021-03-01 DIAGNOSIS — M1A9XX0 Chronic gout, unspecified, without tophus (tophi): Secondary | ICD-10-CM

## 2021-03-01 DIAGNOSIS — N401 Enlarged prostate with lower urinary tract symptoms: Secondary | ICD-10-CM

## 2021-03-05 ENCOUNTER — Ambulatory Visit (INDEPENDENT_AMBULATORY_CARE_PROVIDER_SITE_OTHER): Payer: BC Managed Care – PPO | Admitting: Family

## 2021-03-05 ENCOUNTER — Encounter: Payer: Self-pay | Admitting: Family

## 2021-03-05 ENCOUNTER — Other Ambulatory Visit: Payer: Self-pay

## 2021-03-05 VITALS — BP 123/69 | HR 60 | Temp 97.5°F | Ht 73.0 in | Wt 253.0 lb

## 2021-03-05 DIAGNOSIS — Z0000 Encounter for general adult medical examination without abnormal findings: Secondary | ICD-10-CM | POA: Diagnosis not present

## 2021-03-05 DIAGNOSIS — I10 Essential (primary) hypertension: Secondary | ICD-10-CM | POA: Diagnosis not present

## 2021-03-05 DIAGNOSIS — Z0001 Encounter for general adult medical examination with abnormal findings: Secondary | ICD-10-CM | POA: Diagnosis not present

## 2021-03-05 DIAGNOSIS — M1A9XX0 Chronic gout, unspecified, without tophus (tophi): Secondary | ICD-10-CM

## 2021-03-05 DIAGNOSIS — K219 Gastro-esophageal reflux disease without esophagitis: Secondary | ICD-10-CM

## 2021-03-05 DIAGNOSIS — E669 Obesity, unspecified: Secondary | ICD-10-CM

## 2021-03-05 DIAGNOSIS — E785 Hyperlipidemia, unspecified: Secondary | ICD-10-CM

## 2021-03-05 DIAGNOSIS — N401 Enlarged prostate with lower urinary tract symptoms: Secondary | ICD-10-CM

## 2021-03-05 DIAGNOSIS — R338 Other retention of urine: Secondary | ICD-10-CM

## 2021-03-05 MED ORDER — ALLOPURINOL 300 MG PO TABS: 300.0000 mg | ORAL_TABLET | Freq: Every day | ORAL | 1 refills | Status: DC

## 2021-03-05 MED ORDER — TERAZOSIN HCL 10 MG PO CAPS: 10.0000 mg | ORAL_CAPSULE | Freq: Every day | ORAL | 1 refills | Status: DC

## 2021-03-05 MED ORDER — LOSARTAN POTASSIUM 100 MG PO TABS: 100.0000 mg | ORAL_TABLET | Freq: Every day | ORAL | 3 refills | Status: DC

## 2021-03-05 MED ORDER — METOPROLOL SUCCINATE ER 50 MG PO TB24: ORAL_TABLET | ORAL | 1 refills | Status: DC

## 2021-03-05 MED ORDER — ATORVASTATIN CALCIUM 20 MG PO TABS: 20.0000 mg | ORAL_TABLET | Freq: Every day | ORAL | 3 refills | Status: DC

## 2021-03-05 NOTE — Patient Instructions (Addendum)
Health Maintenance After Age 68 After age 64, you are at a higher risk for certain long-term diseases and infections as well as injuries from falls. Falls are a major cause of broken bones and head injuries in people who are older than age 41. Getting regular preventive care can help to keep you healthy and well. Preventive care includes getting regular testing and making lifestyle changes as recommended by your health care provider. Talk with your health care provider about:  Which screenings and tests you should have. A screening is a test that checks for a disease when you have no symptoms.  A diet and exercise plan that is right for you. What should I know about screenings and tests to prevent falls? Screening and testing are the best ways to find a health problem early. Early diagnosis and treatment give you the best chance of managing medical conditions that are common after age 19. Certain conditions and lifestyle choices may make you more likely to have a fall. Your health care provider may recommend:  Regular vision checks. Poor vision and conditions such as cataracts can make you more likely to have a fall. If you wear glasses, make sure to get your prescription updated if your vision changes.  Medicine review. Work with your health care provider to regularly review all of the medicines you are taking, including over-the-counter medicines. Ask your health care provider about any side effects that may make you more likely to have a fall. Tell your health care provider if any medicines that you take make you feel dizzy or sleepy.  Osteoporosis screening. Osteoporosis is a condition that causes the bones to get weaker. This can make the bones weak and cause them to break more easily.  Blood pressure screening. Blood pressure changes and medicines to control blood pressure can make you feel dizzy.  Strength and balance checks. Your health care provider may recommend certain tests to check your  strength and balance while standing, walking, or changing positions.  Foot health exam. Foot pain and numbness, as well as not wearing proper footwear, can make you more likely to have a fall.  Depression screening. You may be more likely to have a fall if you have a fear of falling, feel emotionally low, or feel unable to do activities that you used to do.  Alcohol use screening. Using too much alcohol can affect your balance and may make you more likely to have a fall. What actions can I take to lower my risk of falls? General instructions  Talk with your health care provider about your risks for falling. Tell your health care provider if: ? You fall. Be sure to tell your health care provider about all falls, even ones that seem minor. ? You feel dizzy, sleepy, or off-balance.  Take over-the-counter and prescription medicines only as told by your health care provider. These include any supplements.  Eat a healthy diet and maintain a healthy weight. A healthy diet includes low-fat dairy products, low-fat (lean) meats, and fiber from whole grains, beans, and lots of fruits and vegetables. Home safety  Remove any tripping hazards, such as rugs, cords, and clutter.  Install safety equipment such as grab bars in bathrooms and safety rails on stairs.  Keep rooms and walkways well-lit. Activity  Follow a regular exercise program to stay fit. This will help you maintain your balance. Ask your health care provider what types of exercise are appropriate for you.  If you need a cane or walker,  use it as recommended by your health care provider.  Wear supportive shoes that have nonskid soles.   Lifestyle  Do not drink alcohol if your health care provider tells you not to drink.  If you drink alcohol, limit how much you have: ? 0-1 drink a day for women. ? 0-2 drinks a day for men.  Be aware of how much alcohol is in your drink. In the [place] one drink equals one typical bottle of beer (12  oz), one-half glass of wine (5 oz), or one shot of hard liquor (1 oz).  Do not use any products that contain nicotine or tobacco, such as cigarettes and e-cigarettes. If you need help quitting, ask your health care provider. Summary  Having a healthy lifestyle and getting preventive care can help to protect your health and wellness after age 58.  Screening and testing are the best way to find a health problem early and help you avoid having a fall. Early diagnosis and treatment give you the best chance for managing medical conditions that are more common for people who are older than age 75.  Falls are a major cause of broken bones and head injuries in people who are older than age 30. Take precautions to prevent a fall at home.  Work with your health care provider to learn what changes you can make to improve your health and wellness and to prevent falls. This information is not intended to replace advice given to you by your health care provider. Make sure you discuss any questions you have with your health care provider. Document Revised: 02/03/2019 Document Reviewed: 08/26/2017 Elsevier Patient Education  2021 [employer]    Gout  Gout is a condition that causes painful swelling of the joints. Gout is a type of inflammation of the joints (arthritis). This condition is caused by having too much uric acid in the body. Uric acid is a chemical that forms when the body breaks down substances called purines. Purines are important for building body proteins. When the body has too much uric acid, sharp crystals can form and build up inside the joints. This causes pain and swelling. Gout attacks can happen quickly and may be very painful (acute gout). Over time, the attacks can affect more joints and become more frequent (chronic gout). Gout can also cause uric acid to build up under the skin and inside the kidneys. What are the causes? This condition is caused by too much uric acid in your  blood. This can happen because:  Your kidneys do not remove enough uric acid from your blood. This is the most common cause.  Your body makes too much uric acid. This can happen with some cancers and cancer treatments. It can also occur if your body is breaking down too many red blood cells (hemolytic anemia).  You eat too many foods that are high in purines. These foods include organ meats and some seafood. Alcohol, especially beer, is also high in purines. A gout attack may be triggered by trauma or stress. What increases the risk? You are more likely to develop this condition if you:  Have a family history of gout.  Are male and middle-aged.  Are male and have gone through menopause.  Are obese.  Frequently drink alcohol, especially beer.  Are dehydrated.  Lose weight too quickly.  Have an organ transplant.  Have lead poisoning.  Take certain medicines, including aspirin, cyclosporine, diuretics, levodopa, and niacin.  Have kidney disease.  Have a skin  condition called psoriasis. What are the signs or symptoms? An attack of acute gout happens quickly. It usually occurs in just one joint. The most common place is the big toe. Attacks often start at night. Other joints that may be affected include joints of the feet, ankle, knee, fingers, wrist, or elbow. Symptoms of this condition may include:  Severe pain.  Warmth.  Swelling.  Stiffness.  Tenderness. The affected joint may be very painful to touch.  Shiny, red, or purple skin.  Chills and fever. Chronic gout may cause symptoms more frequently. More joints may be involved. You may also have white or yellow lumps (tophi) on your hands or feet or in other areas near your joints.   How is this diagnosed? This condition is diagnosed based on your symptoms, medical history, and physical exam. You may have tests, such as:  Blood tests to measure uric acid levels.  Removal of joint fluid with a thin needle  (aspiration) to look for uric acid crystals.  X-rays to look for joint damage. How is this treated? Treatment for this condition has two phases: treating an acute attack and preventing future attacks. Acute gout treatment may include medicines to reduce pain and swelling, including:  NSAIDs.  Steroids. These are strong anti-inflammatory medicines that can be taken by mouth (orally) or injected into a joint.  Colchicine. This medicine relieves pain and swelling when it is taken soon after an attack. It can be given by mouth or through an IV. Preventive treatment may include:  Daily use of smaller doses of NSAIDs or colchicine.  Use of a medicine that reduces uric acid levels in your blood.  Changes to your diet. You may need to see a dietitian about what to eat and drink to prevent gout. Follow these instructions at home: During a gout attack  If directed, put ice on the affected area: ? Put ice in a plastic bag. ? Place a towel between your skin and the bag. ? Leave the ice on for 20 minutes, 2-3 times a day.  Raise (elevate) the affected joint above the level of your heart as often as possible.  Rest the joint as much as possible. If the affected joint is in your leg, you may be given crutches to use.  Follow instructions from your health care provider about eating or drinking restrictions.   Avoiding future gout attacks  Follow a low-purine diet as told by your dietitian or health care provider. Avoid foods and drinks that are high in purines, including liver, kidney, anchovies, asparagus, herring, mushrooms, mussels, and beer.  Maintain a healthy weight or lose weight if you are overweight. If you want to lose weight, talk with your health care provider. It is important that you do not lose weight too quickly.  Start or maintain an exercise program as told by your health care provider. Eating and drinking  Drink enough fluids to keep your urine pale yellow.  If you drink  alcohol: ? Limit how much you use to:  0-1 drink a day for women.  0-2 drinks a day for men. ? Be aware of how much alcohol is in your drink. In the [place] one drink equals one 12 oz bottle of beer (355 mL) one 5 oz glass of wine (148 mL), or one 1 oz glass of hard liquor (44 mL). General instructions  Take over-the-counter and prescription medicines only as told by your health care provider.  Do not drive or use heavy machinery while  taking prescription pain medicine.  Return to your normal activities as told by your health care provider. Ask your health care provider what activities are safe for you.  Keep all follow-up visits as told by your health care provider. This is important. Contact a health care provider if you have:  Another gout attack.  Continuing symptoms of a gout attack after 10 days of treatment.  Side effects from your medicines.  Chills or a fever.  Burning pain when you urinate.  Pain in your lower back or belly. Get help right away if you:  Have severe or uncontrolled pain.  Cannot urinate. Summary  Gout is painful swelling of the joints caused by inflammation.  The most common site of pain is the big toe, but it can affect other joints in the body.  Medicines and dietary changes can help to prevent and treat gout attacks. This information is not intended to replace advice given to you by your health care provider. Make sure you discuss any questions you have with your health care provider. Document Revised: 05/05/2018 Document Reviewed: 05/05/2018 Elsevier Patient Education  2021 [employer].

## 2021-03-05 NOTE — Progress Notes (Signed)
Subjective:    Patient ID: James Nap., male    DOB: 05-Dec-1950, 70 y.o.   MRN: [id number]  Chief Complaint  Patient presents with  . Annual Exam    Pt presents to the office today for chronic follow up. He reports he lost 55 lb since 06/2020.  Hypertension This is a chronic problem. The current episode started more than 1 year ago. The problem has been resolved since onset. The problem is controlled. Pertinent negatives include no malaise/fatigue, peripheral edema or shortness of breath. Risk factors for coronary artery disease include dyslipidemia, obesity and sedentary lifestyle. The current treatment provides moderate improvement. There is no history of CVA or heart failure.  Gastroesophageal Reflux He reports no belching or no heartburn. This is a chronic problem. The current episode started more than 1 year ago. The problem occurs occasionally. He has tried an antacid and a diet change for the symptoms. The treatment provided moderate relief.  Hyperlipidemia This is a chronic problem. The current episode started more than 1 year ago. Exacerbating diseases include obesity. Pertinent negatives include no shortness of breath. Current antihyperlipidemic treatment includes statins. The current treatment provides moderate improvement of lipids. Risk factors for coronary artery disease include dyslipidemia, male sex, hypertension and a sedentary lifestyle.  Gout Takes allopurinol 300 mg daily. Last gout flare up was years ago.     Review of Systems  Constitutional: Negative for malaise/fatigue.  Respiratory: Negative for shortness of breath.   Gastrointestinal: Negative for heartburn.  All other systems reviewed and are negative.  Family History  Problem Relation Age of Onset  . Asthma Mother   . Coronary artery disease Father        x3 Bypasses   . Hypertension Other   . Colon cancer Neg Hx    Social History   Socioeconomic History  . Marital status: Married    Spouse  name: Not on file  . Number of children: Not on file  . Years of education: Not on file  . Highest education level: Not on file  Occupational History  . Not on file  Tobacco Use  . Smoking status: Never Smoker  . Smokeless tobacco: Never Used  Vaping Use  . Vaping Use: Never used  Substance and Sexual Activity  . Alcohol use: No    Alcohol/week: 0.0 standard drinks  . Drug use: No  . Sexual activity: Not on file  Other Topics Concern  . Not on file  Social History Narrative  . Not on file   Social Determinants of Health   Financial Resource Strain: Not on file  Food Insecurity: Not on file  Transportation Needs: Not on file  Physical Activity: Not on file  Stress: Not on file  Social Connections: Not on file       Objective:   Physical Exam Vitals reviewed.  Constitutional:      General: He is not in acute distress.    Appearance: He is well-developed.  HENT:     Head: Normocephalic.     Right Ear: Tympanic membrane normal.     Left Ear: Tympanic membrane normal.  Eyes:     General:        Right eye: No discharge.        Left eye: No discharge.     Pupils: Pupils are equal, round, and reactive to light.  Neck:     Thyroid: No thyromegaly.  Cardiovascular:     Rate and Rhythm: Normal rate and regular  rhythm.     Heart sounds: Normal heart sounds. No murmur heard.   Pulmonary:     Effort: Pulmonary effort is normal. No respiratory distress.     Breath sounds: Normal breath sounds. No wheezing.  Abdominal:     General: Bowel sounds are normal. There is no distension.     Palpations: Abdomen is soft.     Tenderness: There is no abdominal tenderness.  Musculoskeletal:        General: No tenderness. Normal range of motion.     Cervical back: Normal range of motion and neck supple.  Skin:    General: Skin is warm and dry.     Findings: No erythema or rash.  Neurological:     Mental Status: He is alert and oriented to person, place, and time.     Cranial  Nerves: No cranial nerve deficit.     Deep Tendon Reflexes: Reflexes are normal and symmetric.  Psychiatric:        Behavior: Behavior normal.        Thought Content: Thought content normal.        Judgment: Judgment normal.       BP 123/69   Pulse 60   Temp (!) 97.5 F (36.4 C) (Temporal)   Ht _0  (1.854 m)   Wt 253 lb (114.8 kg)   BMI 33.38 kg/m      Assessment & Plan:  James Nap. comes in today with chief complaint of Annual Exam   Diagnosis and orders addressed:  1. Chronic gout without tophus, unspecified cause, unspecified site -Discussed tampering allopurinol  - allopurinol (ZYLOPRIM) 300 MG tablet; Take 1 tablet (300 mg total) by mouth daily.  Dispense: 90 tablet; Refill: 1 - CMP14+EGFR - CBC with Differential/Platelet  2. Hyperlipidemia, unspecified hyperlipidemia type - atorvastatin (LIPITOR) 20 MG tablet; Take 1 tablet (20 mg total) by mouth daily.  Dispense: 90 tablet; Refill: 3 - CMP14+EGFR - CBC with Differential/Platelet  3. Urinary retention due to benign prostatic hyperplasia - terazosin (HYTRIN) 10 MG capsule; Take 1 capsule (10 mg total) by mouth at bedtime. (Needs to be seen before next refill)  Dispense: 90 capsule; Refill: 1 - CMP14+EGFR - CBC with Differential/Platelet  4. Annual physical exam - CMP14+EGFR - CBC with Differential/Platelet - Lipid panel - PSA, total and free - TSH  5. Primary hypertension - metoprolol succinate (TOPROL-XL) 50 MG 24 hr tablet; TAKE 1 TABLET BY MOUTH WITH OR IMMEDIATELY FOLLOWING A MEAL.  Dispense: 90 tablet; Refill: 1 - terazosin (HYTRIN) 10 MG capsule; Take 1 capsule (10 mg total) by mouth at bedtime. (Needs to be seen before next refill)  Dispense: 90 capsule; Refill: 1 - CMP14+EGFR - CBC with Differential/Platelet  6. Gastroesophageal reflux disease, unspecified whether esophagitis present - CMP14+EGFR - CBC with Differential/Platelet  7. Obesity (BMI 30-39.9) - CMP14+EGFR - CBC with  Differential/Platelet   Labs pending Health Maintenance reviewed Diet and exercise encouraged  Follow up plan: 6 months    [name], FNP

## 2021-03-06 LAB — CBC WITH DIFFERENTIAL/PLATELET
Basophils Absolute: 0 10*3/uL (ref 0.0–0.2)
Basos: 1 %
EOS (ABSOLUTE): 0.1 10*3/uL (ref 0.0–0.4)
Eos: 3 %
Hematocrit: 46.7 % (ref 37.5–51.0)
Hemoglobin: 15.8 g/dL (ref 13.0–17.7)
Immature Grans (Abs): 0 10*3/uL (ref 0.0–0.1)
Immature Granulocytes: 0 %
Lymphocytes Absolute: 1.4 10*3/uL (ref 0.7–3.1)
Lymphs: 26 %
MCH: 30.2 pg (ref 26.6–33.0)
MCHC: 33.8 g/dL (ref 31.5–35.7)
MCV: 89 fL (ref 79–97)
Monocytes Absolute: 0.4 10*3/uL (ref 0.1–0.9)
Monocytes: 7 %
Neutrophils Absolute: 3.6 10*3/uL (ref 1.4–7.0)
Neutrophils: 63 %
Platelets: 172 10*3/uL (ref 150–450)
RBC: 5.23 x10E6/uL (ref 4.14–5.80)
RDW: 12.9 % (ref 11.6–15.4)
WBC: 5.6 10*3/uL (ref 3.4–10.8)

## 2021-03-06 LAB — LIPID PANEL
Chol/HDL Ratio: 2.7 ratio (ref 0.0–5.0)
Cholesterol, Total: 84 mg/dL — ABNORMAL LOW (ref 100–199)
HDL: 31 mg/dL — ABNORMAL LOW (ref >39)
LDL Chol Calc (NIH): 33 mg/dL (ref 0–99)
Triglycerides: 102 mg/dL (ref 0–149)
VLDL Cholesterol Cal: 20 mg/dL (ref 5–40)

## 2021-03-06 LAB — CMP14+EGFR
ALT: 17 IU/L (ref 0–44)
AST: 15 IU/L (ref 0–40)
Albumin/Globulin Ratio: 2.3 — ABNORMAL HIGH (ref 1.2–2.2)
Albumin: 4.5 g/dL (ref 3.8–4.8)
Alkaline Phosphatase: 84 IU/L (ref 44–121)
BUN/Creatinine Ratio: 12 (ref 10–24)
BUN: 15 mg/dL (ref 8–27)
Bilirubin Total: 1.1 mg/dL (ref 0.0–1.2)
CO2: 24 mmol/L (ref 20–29)
Calcium: 9.4 mg/dL (ref 8.6–10.2)
Chloride: 103 mmol/L (ref 96–106)
Creatinine, Ser: 1.22 mg/dL (ref 0.76–1.27)
Globulin, Total: 2 g/dL (ref 1.5–4.5)
Glucose: 90 mg/dL (ref 65–99)
Potassium: 4.2 mmol/L (ref 3.5–5.2)
Sodium: 141 mmol/L (ref 134–144)
Total Protein: 6.5 g/dL (ref 6.0–8.5)
eGFR: 65 mL/min/{1.73_m2} (ref >59)

## 2021-03-06 LAB — PSA, TOTAL AND FREE
PSA, Free Pct: 37.4 %
PSA, Free: 0.86 ng/mL
Prostate Specific Ag, Serum: 2.3 ng/mL (ref 0.0–4.0)

## 2021-03-06 LAB — TSH: TSH: 3.25 u[IU]/mL (ref 0.450–4.500)

## 2021-03-08 ENCOUNTER — Other Ambulatory Visit: Payer: Self-pay | Admitting: Family

## 2021-03-22 ENCOUNTER — Other Ambulatory Visit: Payer: Self-pay | Admitting: Family

## 2021-03-22 DIAGNOSIS — I10 Essential (primary) hypertension: Secondary | ICD-10-CM

## 2021-04-08 ENCOUNTER — Other Ambulatory Visit: Payer: Self-pay | Admitting: Family

## 2021-04-08 DIAGNOSIS — K219 Gastro-esophageal reflux disease without esophagitis: Secondary | ICD-10-CM

## 2021-04-08 DIAGNOSIS — I10 Essential (primary) hypertension: Secondary | ICD-10-CM

## 2021-04-12 ENCOUNTER — Ambulatory Visit (INDEPENDENT_AMBULATORY_CARE_PROVIDER_SITE_OTHER): Payer: BC Managed Care – PPO | Admitting: Nurse Practitioner

## 2021-04-12 ENCOUNTER — Encounter: Payer: Self-pay | Admitting: Nurse Practitioner

## 2021-04-12 DIAGNOSIS — R350 Frequency of micturition: Secondary | ICD-10-CM | POA: Diagnosis not present

## 2021-04-12 DIAGNOSIS — R300 Dysuria: Secondary | ICD-10-CM | POA: Diagnosis not present

## 2021-04-12 LAB — URINALYSIS, COMPLETE
Bilirubin, UA: NEGATIVE
Glucose, UA: NEGATIVE
Ketones, UA: NEGATIVE
Leukocytes,UA: NEGATIVE
Nitrite, UA: NEGATIVE
Protein,UA: NEGATIVE
RBC, UA: NEGATIVE
Specific Gravity, UA: 1.015 (ref 1.005–1.030)
Urobilinogen, Ur: 0.2 mg/dL (ref 0.2–1.0)
pH, UA: 6 (ref 5.0–7.5)

## 2021-04-12 LAB — MICROSCOPIC EXAMINATION
Bacteria, UA: NONE SEEN
Epithelial Cells (non renal): NONE SEEN /hpf (ref 0–10)
RBC, Urine: NONE SEEN /hpf (ref 0–2)
WBC, UA: NONE SEEN /hpf (ref 0–5)

## 2021-04-12 MED ORDER — TAMSULOSIN HCL 0.4 MG PO CAPS: 0.4000 mg | ORAL_CAPSULE | Freq: Every day | ORAL | 3 refills | Status: DC

## 2021-04-12 NOTE — Progress Notes (Signed)
   Virtual Visit  Note Due to COVID-19 pandemic this visit was conducted virtually. This visit type was conducted due to national recommendations for restrictions regarding the COVID-19 Pandemic (e.g. social distancing, sheltering in place) in an effort to limit this patient's exposure and mitigate transmission in our community. All issues noted in this document were discussed and addressed.  A physical exam was not performed with this format.  I connected with James Ben. on 04/12/21 at 1:49 by telephone and verified that I am speaking with the correct person using two identifiers. James Ben. is currently located at home and no one is currently with  him during visit. The provider, Mary-Margaret Daphine Deutscher, FNP is located in their office at time of visit.  I discussed the limitations, risks, security and privacy concerns of performing an evaluation and management service by telephone and the availability of in person appointments. I also discussed with the patient that there may be a patient responsible charge related to this service. The patient expressed understanding and agreed to proceed.   History and Present Illness:   Chief Complaint: Urinary Tract Infection   HPI Patient calls in c/o urinary hesitancy. He ha to get up 5-6 x last night to go to the restroom and only dribbled a little.Marland Kitchen He has had this happen in the past and did not have UTI. He denies fever or discomfort. Has not been on any decongestants. He use to be on terazosin and he ran out and has not taken in several days    Review of Systems  Constitutional:  Negative for chills and fever.  Genitourinary:  Positive for frequency and urgency. Negative for dysuria.  Neurological: Negative.   Psychiatric/Behavioral: Negative.    All other systems reviewed and are negative.   Observations/Objective: Alert and oriented- answers all questions appropriately No distress   Assessment and Plan: James Ben. in  today with chief complaint of Urinary Tract Infection   1. Dysuria Urine clear - Urinalysis, Complete  2. Urinary frequency If cant empty bladder then get in wrm shower or tub Back on terazosin as ordered Added flomax to see if helps Needs to see urology if continues.    Follow Up Instructions: prn    I discussed the assessment and treatment plan with the patient. The patient was provided an opportunity to ask questions and all were answered. The patient agreed with the plan and demonstrated an understanding of the instructions.   The patient was advised to call back or seek an in-person evaluation if the symptoms worsen or if the condition fails to improve as anticipated.  The above assessment and management plan was discussed with the patient. The patient verbalized understanding of and has agreed to the management plan. Patient is aware to call the clinic if symptoms persist or worsen. Patient is aware when to return to the clinic for a follow-up visit. Patient educated on when it is appropriate to go to the emergency department.   Time call ended:  2:02  I provided 13 minutes of  non face-to-face time during this encounter.    Mary-Margaret Daphine Deutscher, FNP

## 2021-04-24 ENCOUNTER — Other Ambulatory Visit: Payer: Self-pay

## 2021-04-24 ENCOUNTER — Encounter: Payer: Self-pay | Admitting: Family

## 2021-04-24 ENCOUNTER — Ambulatory Visit (INDEPENDENT_AMBULATORY_CARE_PROVIDER_SITE_OTHER): Payer: BC Managed Care – PPO | Admitting: Family

## 2021-04-24 VITALS — BP 144/78 | HR 54 | Temp 97.9°F | Ht 73.0 in | Wt 255.4 lb

## 2021-04-24 DIAGNOSIS — I10 Essential (primary) hypertension: Secondary | ICD-10-CM

## 2021-04-24 DIAGNOSIS — E669 Obesity, unspecified: Secondary | ICD-10-CM | POA: Diagnosis not present

## 2021-04-24 MED ORDER — AMLODIPINE BESYLATE 5 MG PO TABS: 5.0000 mg | ORAL_TABLET | Freq: Every day | ORAL | 2 refills | Status: DC

## 2021-04-24 NOTE — Patient Instructions (Signed)

## 2021-04-24 NOTE — Progress Notes (Addendum)
Subjective:    Patient ID: James Ben., male    DOB: [birth year], 70 y.o.   MRN: [id number]  Chief Complaint  Patient presents with   Headache    Faint headaches since med change bp is high    Hypertension    Lost 55lb since last October    PT presents to the office today to check on BP. He reports his BP at home has been 160/80's. He has lost 55 lb since October.  Hypertension This is a chronic problem. The current episode started more than 1 year ago. The problem has been resolved since onset. The problem is controlled. Associated symptoms include headaches and malaise/fatigue. Pertinent negatives include no peripheral edema or shortness of breath. Risk factors for coronary artery disease include dyslipidemia.     Review of Systems  Constitutional:  Positive for malaise/fatigue.  Respiratory:  Negative for shortness of breath.   Neurological:  Positive for headaches.  All other systems reviewed and are negative.     Objective:   Physical Exam Vitals reviewed.  Constitutional:      General: He is not in acute distress.    Appearance: He is well-developed. He is obese.  HENT:     Head: Normocephalic.     Right Ear: External ear normal.     Left Ear: External ear normal.  Eyes:     General:        Right eye: No discharge.        Left eye: No discharge.     Pupils: Pupils are equal, round, and reactive to light.  Neck:     Thyroid: No thyromegaly.  Cardiovascular:     Rate and Rhythm: Normal rate and regular rhythm.     Heart sounds: Normal heart sounds. No murmur heard. Pulmonary:     Effort: Pulmonary effort is normal. No respiratory distress.     Breath sounds: Normal breath sounds. No wheezing.  Abdominal:     General: Bowel sounds are normal. There is no distension.     Palpations: Abdomen is soft.     Tenderness: There is no abdominal tenderness.  Musculoskeletal:        General: No tenderness. Normal range of motion.     Cervical back: Normal range of  motion and neck supple.  Skin:    General: Skin is warm and dry.     Findings: No erythema or rash.  Neurological:     Mental Status: He is alert and oriented to person, place, and time.     Cranial Nerves: No cranial nerve deficit.     Deep Tendon Reflexes: Reflexes are normal and symmetric.  Psychiatric:        Behavior: Behavior normal.        Thought Content: Thought content normal.        Judgment: Judgment normal.      BP (!) 144/78   Pulse (!) 54   Temp 97.9 F (36.6 C) (Temporal)   Ht 6\' 1"  (1.854 m)   Wt 255 lb 6.4 oz (115.8 kg)   BMI 33.70 kg/m      Assessment & Plan:  James Solis. comes in today with chief complaint of Headache (Faint headaches since med change bp is high ) and Hypertension (Lost 55lb since last October )   Diagnosis and orders addressed:  1. Primary hypertension Will add Norvasc 5 mg today -Daily blood pressure log given with instructions on how to fill out and told to  bring to next visit -Dash diet information given -Exercise encouraged - Stress Management  -Continue current meds -RTO in 2 weeks  - amLODipine (NORVASC) 5 MG tablet; Take 1 tablet (5 mg total) by mouth daily.  Dispense: 90 tablet; Refill: 2  2. Obesity (BMI 30-39.9)     James Rodney, FNP

## 2021-05-02 ENCOUNTER — Other Ambulatory Visit: Payer: Self-pay | Admitting: Family

## 2021-05-02 MED ORDER — AMLODIPINE BESYLATE 10 MG PO TABS
10.0000 mg | ORAL_TABLET | Freq: Every day | ORAL | 3 refills | Status: DC
Start: 2021-05-02 — End: 2021-11-19

## 2021-07-08 ENCOUNTER — Other Ambulatory Visit: Payer: Self-pay | Admitting: Nurse Practitioner

## 2021-07-08 DIAGNOSIS — R059 Cough, unspecified: Secondary | ICD-10-CM | POA: Diagnosis not present

## 2021-07-08 DIAGNOSIS — R350 Frequency of micturition: Secondary | ICD-10-CM

## 2021-07-08 DIAGNOSIS — Z6831 Body mass index (BMI) 31.0-31.9, adult: Secondary | ICD-10-CM | POA: Diagnosis not present

## 2021-07-08 DIAGNOSIS — U071 COVID-19: Secondary | ICD-10-CM | POA: Diagnosis not present

## 2021-10-14 ENCOUNTER — Other Ambulatory Visit: Payer: Self-pay | Admitting: Family

## 2021-10-14 DIAGNOSIS — I10 Essential (primary) hypertension: Secondary | ICD-10-CM

## 2021-10-14 DIAGNOSIS — N401 Enlarged prostate with lower urinary tract symptoms: Secondary | ICD-10-CM

## 2021-11-06 ENCOUNTER — Other Ambulatory Visit: Payer: Self-pay | Admitting: Family

## 2021-11-06 DIAGNOSIS — I10 Essential (primary) hypertension: Secondary | ICD-10-CM

## 2021-11-06 DIAGNOSIS — N401 Enlarged prostate with lower urinary tract symptoms: Secondary | ICD-10-CM

## 2021-11-06 DIAGNOSIS — R338 Other retention of urine: Secondary | ICD-10-CM

## 2021-11-06 NOTE — Telephone Encounter (Signed)
Appt made for 10/2021

## 2021-11-06 NOTE — Telephone Encounter (Signed)
30 day supply was given 10/14/21  Needs appt for further refills

## 2021-11-15 ENCOUNTER — Ambulatory Visit: Payer: BC Managed Care – PPO | Admitting: Family

## 2021-11-19 ENCOUNTER — Encounter: Payer: Self-pay | Admitting: Family

## 2021-11-19 ENCOUNTER — Ambulatory Visit (INDEPENDENT_AMBULATORY_CARE_PROVIDER_SITE_OTHER): Payer: Medicare HMO | Admitting: Family

## 2021-11-19 VITALS — BP 137/80 | HR 56 | Temp 97.7°F | Ht 73.0 in | Wt 256.4 lb

## 2021-11-19 DIAGNOSIS — E785 Hyperlipidemia, unspecified: Secondary | ICD-10-CM

## 2021-11-19 DIAGNOSIS — I10 Essential (primary) hypertension: Secondary | ICD-10-CM | POA: Diagnosis not present

## 2021-11-19 DIAGNOSIS — R338 Other retention of urine: Secondary | ICD-10-CM | POA: Diagnosis not present

## 2021-11-19 DIAGNOSIS — E669 Obesity, unspecified: Secondary | ICD-10-CM | POA: Diagnosis not present

## 2021-11-19 DIAGNOSIS — N401 Enlarged prostate with lower urinary tract symptoms: Secondary | ICD-10-CM | POA: Diagnosis not present

## 2021-11-19 DIAGNOSIS — K219 Gastro-esophageal reflux disease without esophagitis: Secondary | ICD-10-CM

## 2021-11-19 MED ORDER — METOPROLOL SUCCINATE ER 50 MG PO TB24: ORAL_TABLET | ORAL | 1 refills | Status: DC

## 2021-11-19 MED ORDER — AMLODIPINE BESYLATE 10 MG PO TABS: 10.0000 mg | ORAL_TABLET | Freq: Every day | ORAL | 3 refills | Status: DC

## 2021-11-19 MED ORDER — FLUOCINONIDE 0.05 % EX OINT: TOPICAL_OINTMENT | CUTANEOUS | 0 refills | Status: AC

## 2021-11-19 MED ORDER — TERAZOSIN HCL 10 MG PO CAPS: 10.0000 mg | ORAL_CAPSULE | Freq: Every day | ORAL | 3 refills | Status: DC

## 2021-11-19 MED ORDER — LOSARTAN POTASSIUM 100 MG PO TABS: 100.0000 mg | ORAL_TABLET | Freq: Every day | ORAL | 3 refills | Status: DC

## 2021-11-19 NOTE — Progress Notes (Signed)
Subjective:    Patient ID: James Solis., male    DOB: 1951/01/09, 71 y.o.   MRN: [id number]  Chief Complaint  Patient presents with   Medical Management of Chronic Issues   Pt presents to the office today for chronic follow up. He reports he lost 60 lb since 06/2020.  Hypertension This is a chronic problem. The current episode started more than 1 year ago. The problem has been resolved since onset. The problem is controlled. Pertinent negatives include no malaise/fatigue, peripheral edema or shortness of breath. Risk factors for coronary artery disease include dyslipidemia, obesity and male gender. The current treatment provides moderate improvement.  Gastroesophageal Reflux He complains of belching and heartburn. This is a chronic problem. The current episode started more than 1 year ago. The problem occurs occasionally. Risk factors include obesity. He has tried an antacid for the symptoms. The treatment provided moderate relief.  Hyperlipidemia This is a chronic problem. The current episode started more than 1 year ago. The problem is controlled. Exacerbating diseases include obesity. Pertinent negatives include no shortness of breath. Current antihyperlipidemic treatment includes statins. The current treatment provides moderate improvement of lipids. Risk factors for coronary artery disease include dyslipidemia, male sex, hypertension and a sedentary lifestyle.  Benign Prostatic Hypertrophy This is a chronic problem. The current episode started more than 1 year ago. Irritative symptoms include nocturia (3). Obstructive symptoms include dribbling.     Review of Systems  Constitutional:  Negative for malaise/fatigue.  Respiratory:  Negative for shortness of breath.   Gastrointestinal:  Positive for heartburn.  Genitourinary:  Positive for nocturia (3).  All other systems reviewed and are negative.     Objective:   Physical Exam Vitals reviewed.  Constitutional:      General:  He is not in acute distress.    Appearance: He is well-developed. He is obese.  HENT:     Head: Normocephalic.     Right Ear: Tympanic membrane normal.     Left Ear: Tympanic membrane normal.  Eyes:     General:        Right eye: No discharge.        Left eye: No discharge.     Pupils: Pupils are equal, round, and reactive to light.  Neck:     Thyroid: No thyromegaly.  Cardiovascular:     Rate and Rhythm: Normal rate and regular rhythm.     Heart sounds: Normal heart sounds. No murmur heard. Pulmonary:     Effort: Pulmonary effort is normal. No respiratory distress.     Breath sounds: Normal breath sounds. No wheezing.  Abdominal:     General: Bowel sounds are normal. There is no distension.     Palpations: Abdomen is soft.     Tenderness: There is no abdominal tenderness.  Musculoskeletal:        General: No tenderness. Normal range of motion.     Cervical back: Normal range of motion and neck supple.  Skin:    General: Skin is warm and dry.     Findings: No erythema or rash.  Neurological:     Mental Status: He is alert and oriented to person, place, and time.     Cranial Nerves: No cranial nerve deficit.     Deep Tendon Reflexes: Reflexes are normal and symmetric.  Psychiatric:        Behavior: Behavior normal.        Thought Content: Thought content normal.  Judgment: Judgment normal.      BP 137/80    Pulse (!) 56    Temp 97.7 F (36.5 C) (Temporal)    Ht _0  (1.854 m)    Wt 256 lb 6.4 oz (116.3 kg)    BMI 33.83 kg/m      Assessment & Plan:  James Solis. comes in today with chief complaint of Medical Management of Chronic Issues   Diagnosis and orders addressed:  1. Urinary retention due to benign prostatic hyperplasia - terazosin (HYTRIN) 10 MG capsule; Take 1 capsule (10 mg total) by mouth at bedtime. (Needs to be seen before next refill)  Dispense: 90 capsule; Refill: 3 - CMP14+EGFR - CBC with Differential/Platelet  2. Primary  hypertension - terazosin (HYTRIN) 10 MG capsule; Take 1 capsule (10 mg total) by mouth at bedtime. (Needs to be seen before next refill)  Dispense: 90 capsule; Refill: 3 - amLODipine (NORVASC) 10 MG tablet; Take 1 tablet (10 mg total) by mouth daily.  Dispense: 90 tablet; Refill: 3 - losartan (COZAAR) 100 MG tablet; Take 1 tablet (100 mg total) by mouth daily.  Dispense: 90 tablet; Refill: 3 - metoprolol succinate (TOPROL-XL) 50 MG 24 hr tablet; TAKE 1 TABLET BY MOUTH WITH OR IMMEDIATELY FOLLOWING A MEAL.  Dispense: 90 tablet; Refill: 1 - CMP14+EGFR - CBC with Differential/Platelet  3. Gastroesophageal reflux disease, unspecified whether esophagitis present - CMP14+EGFR - CBC with Differential/Platelet  4. Hyperlipidemia, unspecified hyperlipidemia type - CMP14+EGFR - CBC with Differential/Platelet - Lipid panel  5. Obesity (BMI 30-39.9) - CMP14+EGFR - CBC with Differential/Platelet   Labs pending Health Maintenance reviewed Diet and exercise encouraged  Follow up plan: 6 months    James Dun, FNP

## 2021-11-19 NOTE — Patient Instructions (Signed)

## 2021-11-20 LAB — CBC WITH DIFFERENTIAL/PLATELET
Basophils Absolute: 0 10*3/uL (ref 0.0–0.2)
Basos: 1 %
EOS (ABSOLUTE): 0.2 10*3/uL (ref 0.0–0.4)
Eos: 2 %
Hematocrit: 45.9 % (ref 37.5–51.0)
Hemoglobin: 16 g/dL (ref 13.0–17.7)
Immature Grans (Abs): 0 10*3/uL (ref 0.0–0.1)
Immature Granulocytes: 0 %
Lymphocytes Absolute: 1.2 10*3/uL (ref 0.7–3.1)
Lymphs: 19 %
MCH: 30.5 pg (ref 26.6–33.0)
MCHC: 34.9 g/dL (ref 31.5–35.7)
MCV: 88 fL (ref 79–97)
Monocytes Absolute: 0.5 10*3/uL (ref 0.1–0.9)
Monocytes: 8 %
Neutrophils Absolute: 4.4 10*3/uL (ref 1.4–7.0)
Neutrophils: 70 %
Platelets: 179 10*3/uL (ref 150–450)
RBC: 5.24 x10E6/uL (ref 4.14–5.80)
RDW: 12.5 % (ref 11.6–15.4)
WBC: 6.2 10*3/uL (ref 3.4–10.8)

## 2021-11-20 LAB — LIPID PANEL
Chol/HDL Ratio: 2.3 ratio (ref 0.0–5.0)
Cholesterol, Total: 85 mg/dL — ABNORMAL LOW (ref 100–199)
HDL: 37 mg/dL — ABNORMAL LOW (ref >39)
LDL Chol Calc (NIH): 31 mg/dL (ref 0–99)
Triglycerides: 80 mg/dL (ref 0–149)
VLDL Cholesterol Cal: 17 mg/dL (ref 5–40)

## 2021-11-20 LAB — CMP14+EGFR
ALT: 14 IU/L (ref 0–44)
AST: 14 IU/L (ref 0–40)
Albumin/Globulin Ratio: 2.3 — ABNORMAL HIGH (ref 1.2–2.2)
Albumin: 4.5 g/dL (ref 3.8–4.8)
Alkaline Phosphatase: 69 IU/L (ref 44–121)
BUN/Creatinine Ratio: 12 (ref 10–24)
BUN: 16 mg/dL (ref 8–27)
Bilirubin Total: 0.9 mg/dL (ref 0.0–1.2)
CO2: 26 mmol/L (ref 20–29)
Calcium: 9.2 mg/dL (ref 8.6–10.2)
Chloride: 102 mmol/L (ref 96–106)
Creatinine, Ser: 1.31 mg/dL — ABNORMAL HIGH (ref 0.76–1.27)
Globulin, Total: 2 g/dL (ref 1.5–4.5)
Glucose: 86 mg/dL (ref 70–99)
Potassium: 4.1 mmol/L (ref 3.5–5.2)
Sodium: 144 mmol/L (ref 134–144)
Total Protein: 6.5 g/dL (ref 6.0–8.5)
eGFR: 59 mL/min/{1.73_m2} — ABNORMAL LOW (ref >59)

## 2021-11-20 NOTE — Telephone Encounter (Signed)
Hold for [employer]

## 2021-12-23 DIAGNOSIS — H33311 Horseshoe tear of retina without detachment, right eye: Secondary | ICD-10-CM | POA: Diagnosis not present

## 2021-12-23 DIAGNOSIS — H3321 Serous retinal detachment, right eye: Secondary | ICD-10-CM | POA: Diagnosis not present

## 2021-12-23 DIAGNOSIS — Z01818 Encounter for other preprocedural examination: Secondary | ICD-10-CM | POA: Diagnosis not present

## 2021-12-24 DIAGNOSIS — H2513 Age-related nuclear cataract, bilateral: Secondary | ICD-10-CM | POA: Diagnosis not present

## 2021-12-24 DIAGNOSIS — H33001 Unspecified retinal detachment with retinal break, right eye: Secondary | ICD-10-CM | POA: Diagnosis not present

## 2022-01-29 DIAGNOSIS — H33001 Unspecified retinal detachment with retinal break, right eye: Secondary | ICD-10-CM | POA: Diagnosis not present

## 2022-02-24 ENCOUNTER — Other Ambulatory Visit: Payer: Self-pay | Admitting: Family

## 2022-02-24 DIAGNOSIS — E785 Hyperlipidemia, unspecified: Secondary | ICD-10-CM

## 2022-03-05 DIAGNOSIS — H33001 Unspecified retinal detachment with retinal break, right eye: Secondary | ICD-10-CM | POA: Diagnosis not present

## 2022-03-17 DIAGNOSIS — H25812 Combined forms of age-related cataract, left eye: Secondary | ICD-10-CM | POA: Diagnosis not present

## 2022-03-17 DIAGNOSIS — Z01818 Encounter for other preprocedural examination: Secondary | ICD-10-CM | POA: Diagnosis not present

## 2022-03-17 DIAGNOSIS — H25811 Combined forms of age-related cataract, right eye: Secondary | ICD-10-CM | POA: Diagnosis not present

## 2022-03-19 DIAGNOSIS — H25811 Combined forms of age-related cataract, right eye: Secondary | ICD-10-CM | POA: Diagnosis not present

## 2022-03-31 DIAGNOSIS — R52 Pain, unspecified: Secondary | ICD-10-CM | POA: Diagnosis not present

## 2022-03-31 DIAGNOSIS — Z6832 Body mass index (BMI) 32.0-32.9, adult: Secondary | ICD-10-CM | POA: Diagnosis not present

## 2022-03-31 DIAGNOSIS — B349 Viral infection, unspecified: Secondary | ICD-10-CM | POA: Diagnosis not present

## 2022-03-31 DIAGNOSIS — R509 Fever, unspecified: Secondary | ICD-10-CM | POA: Diagnosis not present

## 2022-04-03 DIAGNOSIS — Z6832 Body mass index (BMI) 32.0-32.9, adult: Secondary | ICD-10-CM | POA: Diagnosis not present

## 2022-04-03 DIAGNOSIS — R509 Fever, unspecified: Secondary | ICD-10-CM | POA: Diagnosis not present

## 2022-04-29 ENCOUNTER — Other Ambulatory Visit: Payer: Self-pay | Admitting: Family

## 2022-04-29 DIAGNOSIS — I10 Essential (primary) hypertension: Secondary | ICD-10-CM

## 2022-04-29 DIAGNOSIS — R350 Frequency of micturition: Secondary | ICD-10-CM

## 2022-05-07 DIAGNOSIS — H25812 Combined forms of age-related cataract, left eye: Secondary | ICD-10-CM | POA: Diagnosis not present

## 2022-05-07 DIAGNOSIS — H35351 Cystoid macular degeneration, right eye: Secondary | ICD-10-CM | POA: Diagnosis not present

## 2022-05-07 DIAGNOSIS — H59819 Chorioretinal scars after surgery for detachment, unspecified eye: Secondary | ICD-10-CM | POA: Diagnosis not present

## 2022-06-06 ENCOUNTER — Ambulatory Visit: Payer: PRIVATE HEALTH INSURANCE | Admitting: Family

## 2022-07-27 ENCOUNTER — Other Ambulatory Visit: Payer: Self-pay | Admitting: Family

## 2022-07-27 DIAGNOSIS — I10 Essential (primary) hypertension: Secondary | ICD-10-CM

## 2022-07-28 ENCOUNTER — Other Ambulatory Visit: Payer: Self-pay | Admitting: Family

## 2022-07-28 DIAGNOSIS — R350 Frequency of micturition: Secondary | ICD-10-CM

## 2022-08-22 ENCOUNTER — Other Ambulatory Visit: Payer: Self-pay | Admitting: Family

## 2022-08-22 DIAGNOSIS — I10 Essential (primary) hypertension: Secondary | ICD-10-CM

## 2022-08-27 ENCOUNTER — Other Ambulatory Visit: Payer: Self-pay | Admitting: Family

## 2022-08-27 DIAGNOSIS — E785 Hyperlipidemia, unspecified: Secondary | ICD-10-CM

## 2022-08-28 ENCOUNTER — Ambulatory Visit (INDEPENDENT_AMBULATORY_CARE_PROVIDER_SITE_OTHER): Payer: Medicare HMO | Admitting: Family

## 2022-08-28 ENCOUNTER — Encounter: Payer: Self-pay | Admitting: Family

## 2022-08-28 VITALS — BP 129/80 | HR 59 | Temp 97.5°F | Ht 73.0 in | Wt 267.0 lb

## 2022-08-28 DIAGNOSIS — Z0000 Encounter for general adult medical examination without abnormal findings: Secondary | ICD-10-CM

## 2022-08-28 DIAGNOSIS — Z23 Encounter for immunization: Secondary | ICD-10-CM | POA: Diagnosis not present

## 2022-08-28 NOTE — Progress Notes (Signed)
Subjective:   James Solis. is a 71 y.o. male who presents for a Welcome to Medicare exam.   Review of Systems: Negative       Objective:    Today's Vitals   08/28/22 1521  BP: 129/80  Pulse: (!) 59  Temp: (!) 97.5 F (36.4 C)  TempSrc: Temporal  Weight: 267 lb (121.1 kg)  Height: 6\' 1"  (1.854 m)   Body mass index is 35.23 kg/m.  Medications Outpatient Encounter Medications as of 08/28/2022  Medication Sig   amLODipine (NORVASC) 10 MG tablet Take 1 tablet (10 mg total) by mouth daily.   atorvastatin (LIPITOR) 20 MG tablet TAKE 1 TABLET BY MOUTH EVERY DAY   losartan (COZAAR) 100 MG tablet Take 1 tablet (100 mg total) by mouth daily.   metoprolol succinate (TOPROL-XL) 50 MG 24 hr tablet TAKE 1TAB BY MOUTH DAILY WITH OR IMMEDIATELY FOLLOWING A MEAL. (NEEDS TO BE SEEN BEFORE NEXT REFILL)   tamsulosin (FLOMAX) 0.4 MG CAPS capsule TAKE 1 CAPSULE BY MOUTH EVERY DAY   terazosin (HYTRIN) 10 MG capsule Take 1 capsule (10 mg total) by mouth at bedtime. (Needs to be seen before next refill)   fluocinonide ointment (LIDEX) 0.05 % APPLY TO AFFECTED AREA TWICE A DAY (Patient not taking: Reported on 08/28/2022)   No facility-administered encounter medications on file as of 08/28/2022.     History: Past Medical History:  Diagnosis Date   Cough    Gout    Hyperlipidemia    Hypertension    Nasal congestion    Rash    bilateral legs    Sinusitis    Past Surgical History:  Procedure Laterality Date   COLONOSCOPY  2006   [facility], unknown md, normal   INGUINAL HERNIA REPAIR  1997    Family History  Problem Relation Age of Onset   Asthma Mother    Coronary artery disease Father        x3 Bypasses    Hypertension Other    Colon cancer Neg Hx    Social History   Occupational History   Not on file  Tobacco Use   Smoking status: Never   Smokeless tobacco: Never  Vaping Use   Vaping Use: Never used  Substance and Sexual Activity   Alcohol use: No     Alcohol/week: 0.0 standard drinks of alcohol   Drug use: No   Sexual activity: Not on file   Tobacco Counseling Counseling given: Not Answered   Immunizations and Health Maintenance Immunization History  Administered Date(s) Administered   Influenza Split 08/15/2020   Influenza, High Dose Seasonal PF 08/22/2019   Influenza,inj,Quad PF,6+ Mos 10/16/2016, 11/19/2021   Influenza-Unspecified 12/15/2013, 08/23/2015   Moderna SARS-COV2 Booster Vaccination 08/29/2020   Moderna Sars-Covid-2 Vaccination 11/29/2019, 12/27/2019, 08/29/2020   Pneumococcal Conjugate-13 06/15/2020   Pneumococcal Polysaccharide-23 05/05/2019   Tdap 10/27/2004, 05/21/2018   Unspecified SARS-COV-2 Vaccination 11/29/2019, 12/27/2019   Zoster, Live 01/26/2014   Health Maintenance Due  Topic Date Due   INFLUENZA VACCINE  05/27/2022    Activities of Daily Living     No data to display          Physical Exam  WNL or other factors deemed appropriate based on the beneficiary's medical and social history and current clinical standards.  Advanced Directives: Does Patient Have a Medical Advance Directive?: No    Assessment:    This is a routine wellness  examination for this patient .  Pt reports he doing well.  Vision/Hearing screen No results found.  Dietary issues and exercise activities discussed:      Goals      Patient Stated     08/28/2022 AWV Goal: Exercise for General Health  Patient will verbalize understanding of the benefits of increased physical activity: Exercising regularly is important. It will improve your overall fitness, flexibility, and endurance. Regular exercise also will improve your overall health. It can help you control your weight, reduce stress, and improve your bone density. Over the next year, patient will increase physical activity as tolerated with a goal of at least 150 minutes of moderate physical activity per week.  You can tell that you are exercising at a  moderate intensity if your heart starts beating faster and you start breathing faster but can still hold a conversation. Moderate-intensity exercise ideas include: Walking 1 mile (1.6 km) in about 15 minutes Biking Hiking Golfing Dancing Water aerobics Patient will verbalize understanding of everyday activities that increase physical activity by providing examples like the following: Yard work, such as: Sales promotion account executive Gardening Washing windows or floors Patient will be able to explain general safety guidelines for exercising:  Before you start a new exercise program, talk with your health care provider. Do not exercise so much that you hurt yourself, feel dizzy, or get very short of breath. Wear comfortable clothes and wear shoes with good support. Drink plenty of water while you exercise to prevent dehydration or heat stroke. Work out until your breathing and your heartbeat get faster.         Depression Screen    08/28/2022    3:20 PM 04/24/2021   10:13 AM 03/05/2021    9:07 AM 02/07/2021    1:24 PM  PHQ 2/9 Scores  PHQ - 2 Score 0 0 0 0  PHQ- 9 Score 0        Fall Risk    08/28/2022    3:20 PM  [place] in the past year? 0    Cognitive Function        08/28/2022    3:29 PM  6CIT Screen  What Year? 0 points  What month? 0 points  What time? 0 points  Count back from 20 0 points  Months in reverse 0 points  Repeat phrase 0 points  Total Score 0 points    Patient Care Team: Sharion Balloon, FNP as PCP - General (Family Medicine)     Plan:     I have personally reviewed and noted the following in the patient's chart:   Medical and social history Use of alcohol, tobacco or illicit drugs  Current medications and supplements Functional ability and status Nutritional status Physical activity Advanced directives List of other physicians Hospitalizations,  surgeries, and ER visits in previous 12 months Vitals Screenings to include cognitive, depression, and falls Referrals and appointments  In addition, I have reviewed and discussed with patient certain preventive protocols, quality metrics, and best practice recommendations. A written personalized care plan for preventive services as well as general preventive health recommendations were provided to patient.    [name], [place] 08/28/2022

## 2022-08-28 NOTE — Patient Instructions (Signed)
Health Maintenance After Age 69 After age 69, you are at a higher risk for certain long-term diseases and infections as well as injuries from falls. Falls are a major cause of broken bones and head injuries in people who are older than age 69. Getting regular preventive care can help to keep you healthy and well. Preventive care includes getting regular testing and making lifestyle changes as recommended by your health care provider. Talk with your health care provider about: Which screenings and tests you should have. A screening is a test that checks for a disease when you have no symptoms. A diet and exercise plan that is right for you. What should I know about screenings and tests to prevent falls? Screening and testing are the best ways to find a health problem early. Early diagnosis and treatment give you the best chance of managing medical conditions that are common after age 69. Certain conditions and lifestyle choices may make you more likely to have a fall. Your health care provider may recommend: Regular vision checks. Poor vision and conditions such as cataracts can make you more likely to have a fall. If you wear glasses, make sure to get your prescription updated if your vision changes. Medicine review. Work with your health care provider to regularly review all of the medicines you are taking, including over-the-counter medicines. Ask your health care provider about any side effects that may make you more likely to have a fall. Tell your health care provider if any medicines that you take make you feel dizzy or sleepy. Strength and balance checks. Your health care provider may recommend certain tests to check your strength and balance while standing, walking, or changing positions. Foot health exam. Foot pain and numbness, as well as not wearing proper footwear, can make you more likely to have a fall. Screenings, including: Osteoporosis screening. Osteoporosis is a condition that causes  the bones to get weaker and break more easily. Blood pressure screening. Blood pressure changes and medicines to control blood pressure can make you feel dizzy. Depression screening. You may be more likely to have a fall if you have a fear of falling, feel depressed, or feel unable to do activities that you used to do. Alcohol use screening. Using too much alcohol can affect your balance and may make you more likely to have a fall. Follow these instructions at home: Lifestyle Do not drink alcohol if: Your health care provider tells you not to drink. If you drink alcohol: Limit how much you have to: 0-1 drink a day for women. 0-2 drinks a day for men. Know how much alcohol is in your drink. In the [place] one drink equals one 12 oz bottle of beer (355 mL), one 5 oz glass of wine (148 mL), or one 1 oz glass of hard liquor (44 mL). Do not use any products that contain nicotine or tobacco. These products include cigarettes, chewing tobacco, and vaping devices, such as e-cigarettes. If you need help quitting, ask your health care provider. Activity  Follow a regular exercise program to stay fit. This will help you maintain your balance. Ask your health care provider what types of exercise are appropriate for you. If you need a cane or walker, use it as recommended by your health care provider. Wear supportive shoes that have nonskid soles. Safety  Remove any tripping hazards, such as rugs, cords, and clutter. Install safety equipment such as grab bars in bathrooms and safety rails on stairs. Keep rooms and walkways   well-lit. General instructions Talk with your health care provider about your risks for falling. Tell your health care provider if: You fall. Be sure to tell your health care provider about all falls, even ones that seem minor. You feel dizzy, tiredness (fatigue), or off-balance. Take over-the-counter and prescription medicines only as told by your health care provider. These include  supplements. Eat a healthy diet and maintain a healthy weight. A healthy diet includes low-fat dairy products, low-fat (lean) meats, and fiber from whole grains, beans, and lots of fruits and vegetables. Stay current with your vaccines. Schedule regular health, dental, and eye exams. Summary Having a healthy lifestyle and getting preventive care can help to protect your health and wellness after age 69. Screening and testing are the best way to find a health problem early and help you avoid having a fall. Early diagnosis and treatment give you the best chance for managing medical conditions that are more common for people who are older than age 69. Falls are a major cause of broken bones and head injuries in people who are older than age 69. Take precautions to prevent a fall at home. Work with your health care provider to learn what changes you can make to improve your health and wellness and to prevent falls. This information is not intended to replace advice given to you by your health care provider. Make sure you discuss any questions you have with your health care provider. Document Revised: 03/04/2021 Document Reviewed: 03/04/2021 Elsevier Patient Education  2023 [employer] Inc.  

## 2022-10-24 ENCOUNTER — Telehealth: Payer: Medicare HMO | Admitting: Nurse Practitioner

## 2022-10-24 DIAGNOSIS — J069 Acute upper respiratory infection, unspecified: Secondary | ICD-10-CM | POA: Diagnosis not present

## 2022-10-24 MED ORDER — PREDNISONE 20 MG PO TABS: 40.0000 mg | ORAL_TABLET | Freq: Every day | ORAL | 0 refills | Status: AC

## 2022-10-24 MED ORDER — BENZONATATE 100 MG PO CAPS
100.0000 mg | ORAL_CAPSULE | Freq: Three times a day (TID) | ORAL | 0 refills | Status: DC | PRN
Start: 2022-10-24 — End: 2022-12-16

## 2022-10-24 NOTE — Progress Notes (Signed)
We are sorry that you are not feeling well.  Here is how we plan to help!  Based on your presentation I believe you most likely have A cough due to a virus.  This is called viral bronchitis and is best treated by rest, plenty of fluids and control of the cough.  You may use Ibuprofen or Tylenol as directed to help your symptoms.     In addition you may use A prescription cough medication called Tessalon Perles 100mg . You may take 1-2 capsules every 8 hours as needed for your cough.  Prednisone 20mg  2 tablets at the same time daily for 5 days.  From your responses in the eVisit questionnaire you describe inflammation in the upper respiratory tract which is causing a significant cough.  This is commonly called Bronchitis and has four common causes:   Allergies Viral Infections Acid Reflux Bacterial Infection Allergies, viruses and acid reflux are treated by controlling symptoms or eliminating the cause. An example might be a cough caused by taking certain blood pressure medications. You stop the cough by changing the medication. Another example might be a cough caused by acid reflux. Controlling the reflux helps control the cough.  USE OF BRONCHODILATOR ("RESCUE") INHALERS: There is a risk from using your bronchodilator too frequently.  The risk is that over-reliance on a medication which only relaxes the muscles surrounding the breathing tubes can reduce the effectiveness of medications prescribed to reduce swelling and congestion of the tubes themselves.  Although you feel brief relief from the bronchodilator inhaler, your asthma may actually be worsening with the tubes becoming more swollen and filled with mucus.  This can delay other crucial treatments, such as oral steroid medications. If you need to use a bronchodilator inhaler daily, several times per day, you should discuss this with your provider.  There are probably better treatments that could be used to keep your asthma under control.      HOME CARE Only take medications as instructed by your medical team. Complete the entire course of an antibiotic. Drink plenty of fluids and get plenty of rest. Avoid close contacts especially the very young and the elderly Cover your mouth if you cough or cough into your sleeve. Always remember to wash your hands A steam or ultrasonic humidifier can help congestion.   GET HELP RIGHT AWAY IF: You develop worsening fever. You become short of breath You cough up blood. Your symptoms persist after you have completed your treatment plan MAKE SURE YOU  Understand these instructions. Will watch your condition. Will get help right away if you are not doing well or get worse.    Thank you for choosing an e-visit.  Your e-visit answers were reviewed by a board certified advanced clinical practitioner to complete your personal care plan. Depending upon the condition, your plan could have included both over the counter or prescription medications.  Please review your pharmacy choice. Make sure the pharmacy is open so you can pick up prescription now. If there is a problem, you may contact your provider through and have the prescription routed to another pharmacy.  Your safety is important to . If you have drug allergies check your prescription carefully.   For the next 24 hours you can use MyChart to ask questions about today's visit, request a non-urgent call back, or ask for a work or school excuse. You will get an email in the next two days asking about your experience. I hope that your e-visit has  been valuable and will speed your recovery.  Mary-Margaret Daphine Deutscher, FNP   5-10 minutes spent reviewing and documenting in chart.

## 2022-10-26 ENCOUNTER — Other Ambulatory Visit: Payer: Self-pay | Admitting: Family

## 2022-10-26 DIAGNOSIS — R350 Frequency of micturition: Secondary | ICD-10-CM

## 2022-11-04 DIAGNOSIS — H25812 Combined forms of age-related cataract, left eye: Secondary | ICD-10-CM | POA: Diagnosis not present

## 2022-11-12 ENCOUNTER — Other Ambulatory Visit: Payer: Self-pay | Admitting: Family

## 2022-11-12 DIAGNOSIS — I10 Essential (primary) hypertension: Secondary | ICD-10-CM

## 2022-11-12 DIAGNOSIS — N401 Enlarged prostate with lower urinary tract symptoms: Secondary | ICD-10-CM

## 2022-11-19 DIAGNOSIS — H25812 Combined forms of age-related cataract, left eye: Secondary | ICD-10-CM | POA: Diagnosis not present

## 2022-11-19 DIAGNOSIS — H2512 Age-related nuclear cataract, left eye: Secondary | ICD-10-CM | POA: Diagnosis not present

## 2022-11-19 DIAGNOSIS — H269 Unspecified cataract: Secondary | ICD-10-CM | POA: Diagnosis not present

## 2022-11-23 ENCOUNTER — Other Ambulatory Visit: Payer: Self-pay | Admitting: Family

## 2022-11-23 DIAGNOSIS — R350 Frequency of micturition: Secondary | ICD-10-CM

## 2022-11-24 ENCOUNTER — Other Ambulatory Visit: Payer: Self-pay | Admitting: Family

## 2022-11-24 DIAGNOSIS — E785 Hyperlipidemia, unspecified: Secondary | ICD-10-CM

## 2022-11-24 NOTE — Telephone Encounter (Signed)
[facility] NTBS 30 days given 10/28/22 

## 2022-11-24 NOTE — Telephone Encounter (Signed)
Pt scheduled for 12/16/2022

## 2022-11-25 ENCOUNTER — Other Ambulatory Visit: Payer: Self-pay | Admitting: Family Medicine

## 2022-11-25 ENCOUNTER — Ambulatory Visit: Payer: PRIVATE HEALTH INSURANCE | Admitting: Family

## 2022-11-25 DIAGNOSIS — R350 Frequency of micturition: Secondary | ICD-10-CM

## 2022-11-25 MED ORDER — TAMSULOSIN HCL 0.4 MG PO CAPS: 0.4000 mg | ORAL_CAPSULE | Freq: Every day | ORAL | 0 refills | Status: DC

## 2022-12-04 ENCOUNTER — Other Ambulatory Visit: Payer: Self-pay | Admitting: Family

## 2022-12-04 DIAGNOSIS — I10 Essential (primary) hypertension: Secondary | ICD-10-CM

## 2022-12-05 ENCOUNTER — Other Ambulatory Visit: Payer: Self-pay | Admitting: Family

## 2022-12-05 DIAGNOSIS — N401 Enlarged prostate with lower urinary tract symptoms: Secondary | ICD-10-CM

## 2022-12-05 DIAGNOSIS — I10 Essential (primary) hypertension: Secondary | ICD-10-CM

## 2022-12-16 ENCOUNTER — Ambulatory Visit (INDEPENDENT_AMBULATORY_CARE_PROVIDER_SITE_OTHER): Payer: Medicare HMO | Admitting: Family

## 2022-12-16 ENCOUNTER — Encounter: Payer: Self-pay | Admitting: Family

## 2022-12-16 VITALS — BP 124/77 | HR 64 | Temp 97.6°F | Ht 73.0 in | Wt 272.2 lb

## 2022-12-16 DIAGNOSIS — R338 Other retention of urine: Secondary | ICD-10-CM | POA: Diagnosis not present

## 2022-12-16 DIAGNOSIS — R350 Frequency of micturition: Secondary | ICD-10-CM

## 2022-12-16 DIAGNOSIS — E669 Obesity, unspecified: Secondary | ICD-10-CM | POA: Diagnosis not present

## 2022-12-16 DIAGNOSIS — Z0001 Encounter for general adult medical examination with abnormal findings: Secondary | ICD-10-CM

## 2022-12-16 DIAGNOSIS — N401 Enlarged prostate with lower urinary tract symptoms: Secondary | ICD-10-CM

## 2022-12-16 DIAGNOSIS — I10 Essential (primary) hypertension: Secondary | ICD-10-CM | POA: Diagnosis not present

## 2022-12-16 DIAGNOSIS — Z0000 Encounter for general adult medical examination without abnormal findings: Secondary | ICD-10-CM

## 2022-12-16 DIAGNOSIS — E785 Hyperlipidemia, unspecified: Secondary | ICD-10-CM | POA: Diagnosis not present

## 2022-12-16 LAB — LIPID PANEL

## 2022-12-16 MED ORDER — TAMSULOSIN HCL 0.4 MG PO CAPS: 0.4000 mg | ORAL_CAPSULE | Freq: Every day | ORAL | 1 refills | Status: DC

## 2022-12-16 MED ORDER — TERAZOSIN HCL 10 MG PO CAPS: 10.0000 mg | ORAL_CAPSULE | Freq: Every day | ORAL | 1 refills | Status: DC

## 2022-12-16 MED ORDER — AMLODIPINE BESYLATE 10 MG PO TABS: 10.0000 mg | ORAL_TABLET | Freq: Every day | ORAL | 1 refills | Status: DC

## 2022-12-16 MED ORDER — LOSARTAN POTASSIUM 100 MG PO TABS: 100.0000 mg | ORAL_TABLET | Freq: Every day | ORAL | 1 refills | Status: DC

## 2022-12-16 MED ORDER — ATORVASTATIN CALCIUM 20 MG PO TABS: 20.0000 mg | ORAL_TABLET | Freq: Every day | ORAL | 1 refills | Status: DC

## 2022-12-16 MED ORDER — METOPROLOL SUCCINATE ER 50 MG PO TB24: ORAL_TABLET | ORAL | 1 refills | Status: DC

## 2022-12-16 NOTE — Progress Notes (Signed)
Subjective:    Patient ID: James Nap., male    DOB: 24-Mar-1951, 72 y.o.   MRN: [id number]  Chief Complaint  Patient presents with   Medical Management of Chronic Issues   PT presents to the office today for CPE.  Hypertension This is a chronic problem. The current episode started more than 1 year ago. The problem has been resolved since onset. The problem is controlled. Pertinent negatives include no malaise/fatigue, peripheral edema or shortness of breath. Risk factors for coronary artery disease include obesity and male gender. The current treatment provides moderate improvement. There is no history of CVA or heart failure.  Hyperlipidemia This is a chronic problem. The current episode started more than 1 year ago. The problem is controlled. Recent lipid tests were reviewed and are normal. Exacerbating diseases include obesity. Pertinent negatives include no shortness of breath. Current antihyperlipidemic treatment includes statins. The current treatment provides moderate improvement of lipids. Risk factors for coronary artery disease include dyslipidemia, hypertension, male sex and a sedentary lifestyle.  Urinary Frequency  This is a chronic problem. The current episode started more than 1 year ago. Associated symptoms include frequency.  Benign Prostatic Hypertrophy This is a chronic problem. The current episode started more than 1 year ago. Irritative symptoms include frequency and nocturia (3). Past treatments include terazosin and tamsulosin. The treatment provided mild relief.      Review of Systems  Constitutional:  Negative for malaise/fatigue.  Respiratory:  Negative for shortness of breath.   Genitourinary:  Positive for frequency and nocturia (3).  All other systems reviewed and are negative.      Objective:   Physical Exam Vitals reviewed.  Constitutional:      General: He is not in acute distress.    Appearance: He is well-developed. He is obese.  HENT:      Head: Normocephalic.     Right Ear: Tympanic membrane normal.     Left Ear: Tympanic membrane normal.  Eyes:     General:        Right eye: No discharge.        Left eye: No discharge.     Pupils: Pupils are equal, round, and reactive to light.  Neck:     Thyroid: No thyromegaly.  Cardiovascular:     Rate and Rhythm: Normal rate and regular rhythm.     Heart sounds: Normal heart sounds. No murmur heard. Pulmonary:     Effort: Pulmonary effort is normal. No respiratory distress.     Breath sounds: Normal breath sounds. No wheezing.  Abdominal:     General: Bowel sounds are normal. There is no distension.     Palpations: Abdomen is soft.     Tenderness: There is no abdominal tenderness.  Musculoskeletal:        General: No tenderness. Normal range of motion.     Cervical back: Normal range of motion and neck supple.  Skin:    General: Skin is warm and dry.     Findings: No erythema or rash.  Neurological:     Mental Status: He is alert and oriented to person, place, and time.     Cranial Nerves: No cranial nerve deficit.     Deep Tendon Reflexes: Reflexes are normal and symmetric.  Psychiatric:        Behavior: Behavior normal.        Thought Content: Thought content normal.        Judgment: Judgment normal.  BP 124/77   Pulse 64   Temp 97.6 F (36.4 C) (Temporal)   Ht 6' 1"$  (1.854 m)   Wt 272 lb 3.2 oz (123.5 kg)   SpO2 96%   BMI 35.91 kg/m      Assessment & Plan:  James Nap. comes in today with chief complaint of Medical Management of Chronic Issues   Diagnosis and orders addressed:  1. Primary hypertension - amLODipine (NORVASC) 10 MG tablet; Take 1 tablet (10 mg total) by mouth daily.  Dispense: 90 tablet; Refill: 1 - losartan (COZAAR) 100 MG tablet; Take 1 tablet (100 mg total) by mouth daily.  Dispense: 90 tablet; Refill: 1 - metoprolol succinate (TOPROL-XL) 50 MG 24 hr tablet; TAKE 1TAB BY MOUTH DAILY WITH OR IMMEDIATELY FOLLOWING A MEAL.  (NEEDS TO BE SEEN BEFORE NEXT REFILL)  Dispense: 90 tablet; Refill: 1 - terazosin (HYTRIN) 10 MG capsule; Take 1 capsule (10 mg total) by mouth at bedtime.  Dispense: 90 capsule; Refill: 1 - CMP14+EGFR - CBC with Differential/Platelet  2. Hyperlipidemia, unspecified hyperlipidemia type - atorvastatin (LIPITOR) 20 MG tablet; Take 1 tablet (20 mg total) by mouth daily.  Dispense: 90 tablet; Refill: 1 - CMP14+EGFR - CBC with Differential/Platelet  3. Urinary frequency - CMP14+EGFR - CBC with Differential/Platelet  4. Urinary retention due to benign prostatic hyperplasia  - tamsulosin (FLOMAX) 0.4 MG CAPS capsule; Take 1 capsule (0.4 mg total) by mouth daily.  Dispense: 90 capsule; Refill: 1 - terazosin (HYTRIN) 10 MG capsule; Take 1 capsule (10 mg total) by mouth at bedtime.  Dispense: 90 capsule; Refill: 1 - CMP14+EGFR - CBC with Differential/Platelet  5. Annual physical exam - CMP14+EGFR - CBC with Differential/Platelet - Lipid panel - PSA, total and free - TSH  6. Obesity (BMI 30-39.9) - CMP14+EGFR - CBC with Differential/Platelet   Labs pending Health Maintenance reviewed Diet and exercise encouraged  Follow up plan: 6 months    [name], FNP

## 2022-12-16 NOTE — Patient Instructions (Signed)
Benign Prostatic Hyperplasia ? ?Benign prostatic hyperplasia (BPH) is an enlarged prostate gland that is caused by the normal aging process. The prostate may get bigger as a man gets older. The condition is not caused by cancer. The prostate is a walnut-sized gland that is involved in the production of semen. It is located in front of the rectum and below the bladder. The bladder stores urine. The urethra carries stored urine out of the body. ?An enlarged prostate can press on the urethra. This can make it harder to pass urine. The buildup of urine in the bladder can cause infection. Back pressure and infection may progress to bladder damage and kidney (renal) failure. ?What are the causes? ?This condition is part of the normal aging process. However, not all men develop problems from this condition. If the prostate enlarges away from the urethra, urine flow will not be blocked. If it enlarges toward the urethra and compresses it, there will be problems passing urine. ?What increases the risk? ?This condition is more likely to develop in men older than 50 years. ?What are the signs or symptoms? ?Symptoms of this condition include: ?Getting up often during the night to urinate. ?Needing to urinate frequently during the day. ?Difficulty starting urine flow. ?Decrease in size and strength of your urine stream. ?Leaking (dribbling) after urinating. ?Inability to pass urine. This needs immediate treatment. ?Inability to completely empty your bladder. ?Pain when you pass urine. This is more common if there is also an infection. ?Urinary tract infection (UTI). ?How is this diagnosed? ?This condition is diagnosed based on your medical history, a physical exam, and your symptoms. Tests will also be done, such as: ?A post-void bladder scan. This measures any amount of urine that may remain in your bladder after you finish urinating. ?A digital rectal exam. In a rectal exam, your health care provider checks your prostate by  putting a lubricated, gloved finger into your rectum to feel the back of your prostate gland. This exam detects the size of your gland and any abnormal lumps or growths. ?An exam of your urine (urinalysis). ?A prostate specific antigen (PSA) screening. This is a blood test used to screen for prostate cancer. ?An ultrasound. This test uses sound waves to electronically produce a picture of your prostate gland. ?Your health care provider may refer you to a specialist in kidney and prostate diseases (urologist). ?How is this treated? ?Once symptoms begin, your health care provider will monitor your condition (active surveillance or watchful waiting). Treatment for this condition will depend on the severity of your condition. Treatment may include: ?Observation and yearly exams. This may be the only treatment needed if your condition and symptoms are mild. ?Medicines to relieve your symptoms, including: ?Medicines to shrink the prostate. ?Medicines to relax the muscle of the prostate. ?Surgery in severe cases. Surgery may include: ?Prostatectomy. In this procedure, the prostate tissue is removed completely through an open incision or with a laparoscope or robotics. ?Transurethral resection of the prostate (TURP). In this procedure, a tool is inserted through the opening at the tip of the penis (urethra). It is used to cut away tissue of the inner core of the prostate. The pieces are removed through the same opening of the penis. This removes the blockage. ?Transurethral incision (TUIP). In this procedure, small cuts are made in the prostate. This lessens the prostate's pressure on the urethra. ?Transurethral microwave thermotherapy (TUMT). This procedure uses microwaves to create heat. The heat destroys and removes a small amount of   prostate tissue. ?Transurethral needle ablation (TUNA). This procedure uses radio frequencies to destroy and remove a small amount of prostate tissue. ?Interstitial laser coagulation (ILC).  This procedure uses a laser to destroy and remove a small amount of prostate tissue. ?Transurethral electrovaporization (TUVP). This procedure uses electrodes to destroy and remove a small amount of prostate tissue. ?Prostatic urethral lift. This procedure inserts an implant to push the lobes of the prostate away from the urethra. ?Follow these instructions at home: ?Take over-the-counter and prescription medicines only as told by your health care provider. ?Monitor your symptoms for any changes. Contact your health care provider with any changes. ?Avoid drinking large amounts of liquid before going to bed or out in public. ?Avoid or reduce how much caffeine or alcohol you drink. ?Give yourself time when you urinate. ?Keep all follow-up visits. This is important. ?Contact a health care provider if: ?You have unexplained back pain. ?Your symptoms do not get better with treatment. ?You develop side effects from the medicine you are taking. ?Your urine becomes very dark or has a bad smell. ?Your lower abdomen becomes distended and you have trouble passing urine. ?Get help right away if: ?You have a fever or chills. ?You suddenly cannot urinate. ?You feel light-headed or very dizzy, or you faint. ?There are large amounts of blood or clots in your urine. ?Your urinary problems become hard to manage. ?You develop moderate to severe low back or flank pain. The flank is the side of your body between the ribs and the hip. ?These symptoms may be an emergency. Get help right away. Call 911. ?Do not wait to see if the symptoms will go away. ?Do not drive yourself to the hospital. ?Summary ?Benign prostatic hyperplasia (BPH) is an enlarged prostate that is caused by the normal aging process. It is not caused by cancer. ?An enlarged prostate can press on the urethra. This can make it hard to pass urine. ?This condition is more likely to develop in men older than 50 years. ?Get help right away if you suddenly cannot urinate. ?This  information is not intended to replace advice given to you by your health care provider. Make sure you discuss any questions you have with your health care provider. ?Document Revised: 05/01/2021 Document Reviewed: 05/01/2021 ?Elsevier Patient Education ? 2023 [employer] ? ?

## 2022-12-17 LAB — CMP14+EGFR
ALT: 23 IU/L (ref 0–44)
AST: 19 IU/L (ref 0–40)
Albumin/Globulin Ratio: 2.4 — ABNORMAL HIGH (ref 1.2–2.2)
Albumin: 4.5 g/dL (ref 3.9–4.9)
Alkaline Phosphatase: 70 IU/L (ref 44–121)
BUN/Creatinine Ratio: 12 (ref 10–24)
BUN: 14 mg/dL (ref 8–27)
Bilirubin Total: 0.9 mg/dL (ref 0.0–1.2)
CO2: 23 mmol/L (ref 20–29)
Calcium: 9.2 mg/dL (ref 8.6–10.2)
Chloride: 105 mmol/L (ref 96–106)
Creatinine, Ser: 1.17 mg/dL (ref 0.76–1.27)
Globulin, Total: 1.9 g/dL (ref 1.5–4.5)
Glucose: 99 mg/dL (ref 70–99)
Potassium: 3.9 mmol/L (ref 3.5–5.2)
Sodium: 143 mmol/L (ref 134–144)
Total Protein: 6.4 g/dL (ref 6.0–8.5)
eGFR: 67 mL/min/{1.73_m2} (ref >59)

## 2022-12-17 LAB — CBC WITH DIFFERENTIAL/PLATELET
Basophils Absolute: 0 10*3/uL (ref 0.0–0.2)
Basos: 1 %
EOS (ABSOLUTE): 0.2 10*3/uL (ref 0.0–0.4)
Eos: 4 %
Hematocrit: 45.8 % (ref 37.5–51.0)
Hemoglobin: 15.4 g/dL (ref 13.0–17.7)
Immature Grans (Abs): 0 10*3/uL (ref 0.0–0.1)
Immature Granulocytes: 0 %
Lymphocytes Absolute: 1.1 10*3/uL (ref 0.7–3.1)
Lymphs: 23 %
MCH: 29.6 pg (ref 26.6–33.0)
MCHC: 33.6 g/dL (ref 31.5–35.7)
MCV: 88 fL (ref 79–97)
Monocytes Absolute: 0.4 10*3/uL (ref 0.1–0.9)
Monocytes: 8 %
Neutrophils Absolute: 3.2 10*3/uL (ref 1.4–7.0)
Neutrophils: 64 %
Platelets: 163 10*3/uL (ref 150–450)
RBC: 5.21 x10E6/uL (ref 4.14–5.80)
RDW: 12.7 % (ref 11.6–15.4)
WBC: 4.8 10*3/uL (ref 3.4–10.8)

## 2022-12-17 LAB — LIPID PANEL
Chol/HDL Ratio: 2.4 ratio (ref 0.0–5.0)
Cholesterol, Total: 93 mg/dL — ABNORMAL LOW (ref 100–199)
HDL: 39 mg/dL — ABNORMAL LOW (ref >39)
LDL Chol Calc (NIH): 36 mg/dL (ref 0–99)
Triglycerides: 93 mg/dL (ref 0–149)
VLDL Cholesterol Cal: 18 mg/dL (ref 5–40)

## 2022-12-17 LAB — TSH: TSH: 2.37 u[IU]/mL (ref 0.450–4.500)

## 2022-12-17 LAB — PSA, TOTAL AND FREE
PSA, Free Pct: 41.4 %
PSA, Free: 0.91 ng/mL
Prostate Specific Ag, Serum: 2.2 ng/mL (ref 0.0–4.0)

## 2023-01-09 DIAGNOSIS — H59819 Chorioretinal scars after surgery for detachment, unspecified eye: Secondary | ICD-10-CM | POA: Diagnosis not present

## 2023-01-09 DIAGNOSIS — H35352 Cystoid macular degeneration, left eye: Secondary | ICD-10-CM | POA: Diagnosis not present

## 2023-01-09 DIAGNOSIS — H59811 Chorioretinal scars after surgery for detachment, right eye: Secondary | ICD-10-CM | POA: Diagnosis not present

## 2023-03-13 DIAGNOSIS — H35352 Cystoid macular degeneration, left eye: Secondary | ICD-10-CM | POA: Diagnosis not present

## 2023-06-06 ENCOUNTER — Other Ambulatory Visit: Payer: Self-pay | Admitting: Family

## 2023-06-06 DIAGNOSIS — E785 Hyperlipidemia, unspecified: Secondary | ICD-10-CM

## 2023-06-06 DIAGNOSIS — I10 Essential (primary) hypertension: Secondary | ICD-10-CM

## 2023-06-16 ENCOUNTER — Other Ambulatory Visit: Payer: Self-pay | Admitting: Family

## 2023-06-16 DIAGNOSIS — N401 Enlarged prostate with lower urinary tract symptoms: Secondary | ICD-10-CM

## 2023-06-16 DIAGNOSIS — I10 Essential (primary) hypertension: Secondary | ICD-10-CM

## 2023-07-09 ENCOUNTER — Other Ambulatory Visit: Payer: Self-pay | Admitting: Family

## 2023-07-09 DIAGNOSIS — N401 Enlarged prostate with lower urinary tract symptoms: Secondary | ICD-10-CM

## 2023-07-09 MED ORDER — TAMSULOSIN HCL 0.4 MG PO CAPS
0.4000 mg | ORAL_CAPSULE | Freq: Every day | ORAL | 0 refills | Status: DC
Start: 2023-07-09 — End: 2023-07-17

## 2023-07-09 NOTE — Telephone Encounter (Signed)
[facility] pt NTBS 30-d given 06/17/23

## 2023-07-09 NOTE — Telephone Encounter (Signed)
Patient has appt 9-20 with [facility]

## 2023-07-09 NOTE — Addendum Note (Signed)
Addended by: Julious Payer D on: 07/09/2023 04:01 PM   Modules accepted: Orders

## 2023-07-17 ENCOUNTER — Encounter: Payer: Self-pay | Admitting: Family

## 2023-07-17 ENCOUNTER — Ambulatory Visit (INDEPENDENT_AMBULATORY_CARE_PROVIDER_SITE_OTHER): Payer: Medicare HMO | Admitting: Family

## 2023-07-17 VITALS — BP 131/78 | HR 69 | Temp 97.9°F | Ht 73.0 in | Wt 281.0 lb

## 2023-07-17 DIAGNOSIS — R338 Other retention of urine: Secondary | ICD-10-CM

## 2023-07-17 DIAGNOSIS — I10 Essential (primary) hypertension: Secondary | ICD-10-CM | POA: Diagnosis not present

## 2023-07-17 DIAGNOSIS — E785 Hyperlipidemia, unspecified: Secondary | ICD-10-CM

## 2023-07-17 DIAGNOSIS — M1A9XX0 Chronic gout, unspecified, without tophus (tophi): Secondary | ICD-10-CM

## 2023-07-17 DIAGNOSIS — E669 Obesity, unspecified: Secondary | ICD-10-CM

## 2023-07-17 DIAGNOSIS — N401 Enlarged prostate with lower urinary tract symptoms: Secondary | ICD-10-CM | POA: Diagnosis not present

## 2023-07-17 DIAGNOSIS — Z23 Encounter for immunization: Secondary | ICD-10-CM | POA: Diagnosis not present

## 2023-07-17 DIAGNOSIS — F40240 Claustrophobia: Secondary | ICD-10-CM | POA: Diagnosis not present

## 2023-07-17 MED ORDER — METOPROLOL SUCCINATE ER 50 MG PO TB24
ORAL_TABLET | ORAL | 1 refills | Status: DC
Start: 2023-07-17 — End: 2024-02-04

## 2023-07-17 MED ORDER — TERAZOSIN HCL 10 MG PO CAPS
10.0000 mg | ORAL_CAPSULE | Freq: Every day | ORAL | 1 refills | Status: DC
Start: 2023-07-17 — End: 2024-01-18

## 2023-07-17 MED ORDER — BUSPIRONE HCL 5 MG PO TABS: 5.0000 mg | ORAL_TABLET | Freq: Three times a day (TID) | ORAL | 1 refills | Status: DC | PRN

## 2023-07-17 MED ORDER — LOSARTAN POTASSIUM 100 MG PO TABS
100.0000 mg | ORAL_TABLET | Freq: Every day | ORAL | 2 refills | Status: DC
Start: 2023-07-17 — End: 2024-05-16

## 2023-07-17 MED ORDER — ATORVASTATIN CALCIUM 20 MG PO TABS
20.0000 mg | ORAL_TABLET | Freq: Every day | ORAL | 2 refills | Status: DC
Start: 2023-07-17 — End: 2024-04-19

## 2023-07-17 MED ORDER — TAMSULOSIN HCL 0.4 MG PO CAPS
0.8000 mg | ORAL_CAPSULE | Freq: Every day | ORAL | 1 refills | Status: DC
Start: 2023-07-17 — End: 2024-01-18

## 2023-07-17 MED ORDER — AMLODIPINE BESYLATE 10 MG PO TABS
10.0000 mg | ORAL_TABLET | Freq: Every day | ORAL | 2 refills | Status: DC
Start: 2023-07-17 — End: 2024-04-19

## 2023-07-17 NOTE — Progress Notes (Signed)
Subjective:    Patient ID: James Ben., male    DOB: 09-09-1951, 72 y.o.   MRN: [id number]  Chief Complaint  Patient presents with   Medical Management of Chronic Issues   PT presents to the office today for chronic follow up. Has hx of gout and stop all medications over a year ago and no flare up.   He retired, but has recently went back to work for 32 hours a week.   Complaining of feeling claustrophobic and driving over heights. He states this has started disrupting his life because he will look at maps before driving and avoid areas.  Hypertension This is a chronic problem. The current episode started more than 1 year ago. The problem has been resolved since onset. The problem is controlled. Pertinent negatives include no malaise/fatigue, peripheral edema or shortness of breath. The current treatment provides moderate improvement.  Hyperlipidemia This is a chronic problem. The current episode started more than 1 year ago. The problem is controlled. Recent lipid tests were reviewed and are normal. Exacerbating diseases include obesity. Pertinent negatives include no shortness of breath. Current antihyperlipidemic treatment includes statins. The current treatment provides moderate improvement of lipids. Risk factors for coronary artery disease include dyslipidemia, hypertension and a sedentary lifestyle.  Benign Prostatic Hypertrophy This is a chronic problem. The current episode started more than 1 year ago. Irritative symptoms include nocturia (3).      Review of Systems  Constitutional:  Negative for malaise/fatigue.  Respiratory:  Negative for shortness of breath.   Genitourinary:  Positive for nocturia (3).  All other systems reviewed and are negative.      Objective:   Physical Exam Vitals reviewed.  Constitutional:      General: He is not in acute distress.    Appearance: He is well-developed. He is obese.  HENT:     Head: Normocephalic.     Right Ear: Tympanic  membrane normal.     Left Ear: Tympanic membrane normal.  Eyes:     General:        Right eye: No discharge.        Left eye: No discharge.     Pupils: Pupils are equal, round, and reactive to light.  Neck:     Thyroid: No thyromegaly.  Cardiovascular:     Rate and Rhythm: Normal rate and regular rhythm.     Heart sounds: Normal heart sounds. No murmur heard. Pulmonary:     Effort: Pulmonary effort is normal. No respiratory distress.     Breath sounds: Normal breath sounds. No wheezing.  Abdominal:     General: Bowel sounds are normal. There is no distension.     Palpations: Abdomen is soft.     Tenderness: There is no abdominal tenderness.  Musculoskeletal:        General: No tenderness. Normal range of motion.     Cervical back: Normal range of motion and neck supple.  Skin:    General: Skin is warm and dry.     Findings: No erythema or rash.  Neurological:     Mental Status: He is alert and oriented to person, place, and time.     Cranial Nerves: No cranial nerve deficit.     Deep Tendon Reflexes: Reflexes are normal and symmetric.  Psychiatric:        Behavior: Behavior normal.        Thought Content: Thought content normal.        Judgment: Judgment normal.  BP 131/78   Pulse 69   Temp 97.9 F (36.6 C) (Temporal)   Ht 6\' 1"  (1.854 m)   Wt 281 lb (127.5 kg)   SpO2 96%   BMI 37.07 kg/m      Assessment & Plan:  James Ben. comes in today with chief complaint of Medical Management of Chronic Issues   Diagnosis and orders addressed:  1. Primary hypertension - amLODipine (NORVASC) 10 MG tablet; Take 1 tablet (10 mg total) by mouth daily.  Dispense: 90 tablet; Refill: 2 - losartan (COZAAR) 100 MG tablet; Take 1 tablet (100 mg total) by mouth daily.  Dispense: 90 tablet; Refill: 2 - metoprolol succinate (TOPROL-XL) 50 MG 24 hr tablet; TAKE 1TAB BY MOUTH DAILY WITH OR IMMEDIATELY FOLLOWING A MEAL.  Dispense: 90 tablet; Refill: 1 - terazosin (HYTRIN) 10  MG capsule; Take 1 capsule (10 mg total) by mouth at bedtime.  Dispense: 90 capsule; Refill: 1 - CMP14+EGFR  2. Hyperlipidemia, unspecified hyperlipidemia type - atorvastatin (LIPITOR) 20 MG tablet; Take 1 tablet (20 mg total) by mouth daily.  Dispense: 90 tablet; Refill: 2 - CMP14+EGFR  3. Urinary retention due to benign prostatic hyperplasia Will increase Flomax to 0.8 mg from 0.4 mg - tamsulosin (FLOMAX) 0.4 MG CAPS capsule; Take 2 capsules (0.8 mg total) by mouth daily.  Dispense: 180 capsule; Refill: 1 - terazosin (HYTRIN) 10 MG capsule; Take 1 capsule (10 mg total) by mouth at bedtime.  Dispense: 90 capsule; Refill: 1 - CMP14+EGFR  4. Encounter for immunization - Flu Vaccine Trivalent High Dose (Fluad) - CMP14+EGFR  5. Obesity (BMI 30-39.9) - CMP14+EGFR  6. Chronic gout without tophus, unspecified cause, unspecified site - CMP14+EGFR  7. Claustrophobia Will start Buspar 5 mg TID prn as needed  Stress management  - busPIRone (BUSPAR) 5 MG tablet; Take 1 tablet (5 mg total) by mouth 3 (three) times daily as needed.  Dispense: 90 tablet; Refill: 1   Labs pending Will increase Flomax to 0.8 mg from 0.4 mg Will start Buspar 5 mg TID prn  Health Maintenance reviewed Diet and exercise encouraged  Follow up plan: 6 months    Jannifer Rodney, FNP

## 2023-07-17 NOTE — Patient Instructions (Signed)

## 2023-07-18 LAB — CMP14+EGFR
ALT: 28 IU/L (ref 0–44)
AST: 24 IU/L (ref 0–40)
Albumin: 4.4 g/dL (ref 3.9–4.9)
Alkaline Phosphatase: 75 IU/L (ref 44–121)
BUN/Creatinine Ratio: 14 (ref 10–24)
BUN: 15 mg/dL (ref 8–27)
Bilirubin Total: 1.1 mg/dL (ref 0.0–1.2)
CO2: 25 mmol/L (ref 20–29)
Calcium: 9 mg/dL (ref 8.6–10.2)
Chloride: 103 mmol/L (ref 96–106)
Creatinine, Ser: 1.11 mg/dL (ref 0.76–1.27)
Globulin, Total: 2 g/dL (ref 1.5–4.5)
Glucose: 89 mg/dL (ref 70–99)
Potassium: 3.9 mmol/L (ref 3.5–5.2)
Sodium: 143 mmol/L (ref 134–144)
Total Protein: 6.4 g/dL (ref 6.0–8.5)
eGFR: 71 mL/min/{1.73_m2} (ref >59)

## 2023-08-04 ENCOUNTER — Telehealth: Payer: Medicare HMO | Admitting: Physician Assistant

## 2023-08-04 DIAGNOSIS — M545 Low back pain, unspecified: Secondary | ICD-10-CM

## 2023-08-04 MED ORDER — BACLOFEN 10 MG PO TABS
10.0000 mg | ORAL_TABLET | Freq: Three times a day (TID) | ORAL | 0 refills | Status: DC
Start: 2023-08-04 — End: 2023-12-18

## 2023-08-04 NOTE — Progress Notes (Signed)

## 2023-08-04 NOTE — Progress Notes (Signed)
I have spent 5 minutes in review of e-visit questionnaire, review and updating patient chart, medical decision making and response to patient.   Mia Milan Cody Jacklynn Dehaas, PA-C    

## 2023-08-08 ENCOUNTER — Other Ambulatory Visit: Payer: Self-pay | Admitting: Family

## 2023-08-08 DIAGNOSIS — F40240 Claustrophobia: Secondary | ICD-10-CM

## 2023-09-02 ENCOUNTER — Ambulatory Visit: Payer: Medicare HMO

## 2023-09-02 VITALS — Ht 74.0 in | Wt 274.0 lb

## 2023-09-02 DIAGNOSIS — Z0000 Encounter for general adult medical examination without abnormal findings: Secondary | ICD-10-CM

## 2023-09-02 NOTE — Progress Notes (Signed)
Subjective:   James Solis. is a 72 y.o. male who presents for an Initial Medicare Annual Wellness Visit.  Visit Complete: Virtual I connected with  James Solis. on 09/02/23 by a audio enabled telemedicine application and verified that I am speaking with the correct person using two identifiers.  Patient Location: Home  Provider Location: Home Office  I discussed the limitations of evaluation and management by telemedicine. The patient expressed understanding and agreed to proceed.  Vital Signs: Because this visit was a virtual/telehealth visit, some criteria may be missing or patient reported. Any vitals not documented were not able to be obtained and vitals that have been documented are patient reported.  Patient Medicare AWV questionnaire was completed by the patient on 09/02/2023; I have confirmed that all information answered by patient is correct and no changes since this date.  Cardiac Risk Factors include: advanced age (>73men, >28 women)     Objective:    Today's Vitals   09/02/23 1102  Weight: 274 lb (124.3 kg)  Height: 6\' 2"  (1.88 m)   Body mass index is 35.18 kg/m.     09/02/2023   11:05 AM 08/28/2022    3:24 PM 05/30/2015    4:21 PM  Advanced Directives  Does Patient Have a Medical Advance Directive? Yes No No  Type of Estate agent of [place];Living will    Copy of Healthcare Power of Attorney in Chart? No - copy requested      Current Medications (verified) Outpatient Encounter Medications as of 09/02/2023  Medication Sig   amLODipine (NORVASC) 10 MG tablet Take 1 tablet (10 mg total) by mouth daily.   atorvastatin (LIPITOR) 20 MG tablet Take 1 tablet (20 mg total) by mouth daily.   baclofen (LIORESAL) 10 MG tablet Take 1 tablet (10 mg total) by mouth 3 (three) times daily.   busPIRone (BUSPAR) 5 MG tablet TAKE 1 TABLET BY MOUTH 3 TIMES DAILY AS NEEDED.   fluocinonide ointment (LIDEX) 0.05 % APPLY TO AFFECTED AREA TWICE A DAY    losartan (COZAAR) 100 MG tablet Take 1 tablet (100 mg total) by mouth daily.   metoprolol succinate (TOPROL-XL) 50 MG 24 hr tablet TAKE 1TAB BY MOUTH DAILY WITH OR IMMEDIATELY FOLLOWING A MEAL.   tamsulosin (FLOMAX) 0.4 MG CAPS capsule Take 2 capsules (0.8 mg total) by mouth daily.   terazosin (HYTRIN) 10 MG capsule Take 1 capsule (10 mg total) by mouth at bedtime.   No facility-administered encounter medications on file as of 09/02/2023.    Allergies (verified) Patient has no known allergies.   History: Past Medical History:  Diagnosis Date   Cough    Gout    Hyperlipidemia    Hypertension    Nasal congestion    Rash    bilateral legs    Sinusitis    Past Surgical History:  Procedure Laterality Date   COLONOSCOPY  2006   [facility], unknown md, normal   INGUINAL HERNIA REPAIR  1997   Family History  Problem Relation Age of Onset   Asthma Mother    Coronary artery disease Father        x3 Bypasses    Hypertension Other    Colon cancer Neg Hx    Social History   Socioeconomic History   Marital status: Married    Spouse name: Not on file   Number of children: Not on file   Years of education: Not on file   Highest education level: Not  on file  Occupational History   Not on file  Tobacco Use   Smoking status: Never   Smokeless tobacco: Never  Vaping Use   Vaping status: Never Used  Substance and Sexual Activity   Alcohol use: No    Alcohol/week: 0.0 standard drinks of alcohol   Drug use: No   Sexual activity: Not on file  Other Topics Concern   Not on file  Social History Narrative   Not on file   Social Determinants of Health   Financial Resource Strain: Low Risk  (09/02/2023)   Overall Financial Resource Strain (CARDIA)    Difficulty of Paying Living Expenses: Not hard at all  Food Insecurity: No Food Insecurity (09/02/2023)   Hunger Vital Sign    Worried About Running Out of Food in the Last Year: Never true    Ran Out of Food in the Last  Year: Never true  Transportation Needs: No Transportation Needs (09/02/2023)   PRAPARE - Administrator, Civil Service (Medical): No    Lack of Transportation (Non-Medical): No  Physical Activity: Sufficiently Active (09/02/2023)   Exercise Vital Sign    Days of Exercise per Week: 5 days    Minutes of Exercise per Session: 30 min  Stress: No Stress Concern Present (09/02/2023)   [employer] of Occupational Health - Occupational Stress Questionnaire    Feeling of Stress : Not at all  Social Connections: Moderately Integrated (09/02/2023)   Social Connection and Isolation Panel [NHANES]    Frequency of Communication with Friends and Family: More than three times a week    Frequency of Social Gatherings with Friends and Family: More than three times a week    Attends Religious Services: More than 4 times per year    Active Member of [employer] or Organizations: No    Attends Engineer, structural: Never    Marital Status: Married    Tobacco Counseling Counseling given: Not Answered   Clinical Intake:  Pre-visit preparation completed: Yes  Pain : No/denies pain     Nutritional Risks: None Diabetes: No  How often do you need to have someone help you when you read instructions, pamphlets, or other written materials from your doctor or pharmacy?: 1 - Never  Interpreter Needed?: No  Information entered by :: James Ora, LPN   Activities of Daily Living    09/02/2023   11:05 AM  In your present state of health, do you have any difficulty performing the following activities:  Hearing? 0  Vision? 0  Difficulty concentrating or making decisions? 0  Walking or climbing stairs? 0  Dressing or bathing? 0  Doing errands, shopping? 0  Preparing Food and eating ? N  Using the Toilet? N  In the past six months, have you accidently leaked urine? N  Do you have problems with loss of bowel control? N  Managing your Medications? N  Managing your Finances? N   Housekeeping or managing your Housekeeping? N    Patient Care Team: Junie Spencer, FNP as PCP - General (Family Medicine)  Indicate any recent Medical Services you may have received from other than [facility] providers in the past year (date may be approximate).     Assessment:   This is a routine wellness examination for [place].  Hearing/Vision screen Vision Screening - Comments:: Wears rx glasses - up to date with routine eye exams with  Dr.Le    Goals Addressed  This Visit's Progress    DIET - EAT MORE FRUITS AND VEGETABLES         Depression Screen    09/02/2023   11:04 AM 07/17/2023   10:39 AM 12/16/2022   10:01 AM 08/28/2022    3:20 PM 04/24/2021   10:13 AM 03/05/2021    9:07 AM 02/07/2021    1:24 PM  PHQ 2/9 Scores  PHQ - 2 Score 0 0 0 0 0 0 0  PHQ- 9 Score 0 0 0 0       Fall Risk    09/02/2023   11:03 AM 07/17/2023   10:39 AM 12/16/2022   10:01 AM 08/28/2022    3:20 PM 03/05/2021    9:07 AM  Fall Risk   Falls in the past year? 0 0 0 0 0  Number falls in past yr: 0      Injury with Fall? 0      Risk for fall due to : No Fall Risks      Follow up Falls prevention discussed        MEDICARE RISK AT HOME: Medicare Risk at Home Any stairs in or around the home?: Yes If so, are there any without handrails?: No Home free of loose throw rugs in walkways, pet beds, electrical cords, etc?: Yes Adequate lighting in your home to reduce risk of falls?: Yes Life alert?: No Use of a cane, walker or w/c?: No Grab bars in the bathroom?: Yes Shower chair or bench in shower?: Yes Elevated toilet seat or a handicapped toilet?: Yes  TIMED UP AND GO:  Was the test performed? No    Cognitive Function:        09/02/2023   11:06 AM 08/28/2022    3:29 PM  6CIT Screen  What Year? 0 points 0 points  What month? 0 points 0 points  What time? 0 points 0 points  Count back from 20 0 points 0 points  Months in reverse 0 points 0 points  Repeat phrase 0 points 0  points  Total Score 0 points 0 points    Immunizations Immunization History  Administered Date(s) Administered   Fluad Quad(high Dose 65+) 08/28/2022   Fluad Trivalent(High Dose 65+) 07/17/2023   Influenza Split 08/15/2020   Influenza, High Dose Seasonal PF 08/22/2019   Influenza,inj,Quad PF,6+ Mos 10/16/2016, 11/19/2021   Influenza-Unspecified 12/15/2013, 08/23/2015   Moderna SARS-COV2 Booster Vaccination 08/29/2020   Moderna Sars-Covid-2 Vaccination 11/29/2019, 12/27/2019, 08/29/2020   Pneumococcal Conjugate-13 06/15/2020   Pneumococcal Polysaccharide-23 05/05/2019   Tdap 10/27/2004, 05/21/2018   Unspecified SARS-COV-2 Vaccination 11/29/2019, 12/27/2019   Zoster, Live 01/26/2014    TDAP status: Up to date  Flu Vaccine status: Due, Education has been provided regarding the importance of this vaccine. Advised may receive this vaccine at local pharmacy or Health Dept. Aware to provide a copy of the vaccination record if obtained from local pharmacy or Health Dept. Verbalized acceptance and understanding.  Pneumococcal vaccine status: Up to date  Covid-19 vaccine status: Completed vaccines  Qualifies for Shingles Vaccine? Yes   Zostavax completed No   Shingrix Completed?: No.    Education has been provided regarding the importance of this vaccine. Patient has been advised to call insurance company to determine out of pocket expense if they have not yet received this vaccine. Advised may also receive vaccine at local pharmacy or Health Dept. Verbalized acceptance and understanding.  Screening Tests Health Maintenance  Topic Date Due   COVID-19 Vaccine (7 - 2023-24  season) 06/28/2023   Zoster Vaccines- Shingrix (1 of 2) 10/16/2023 (Originally 06/27/2003)   Medicare Annual Wellness (AWV)  09/01/2024   Colonoscopy  06/12/2025   DTaP/Tdap/Td (3 - Td or Tdap) 05/21/2028   Pneumonia Vaccine 73+ Years old  Completed   INFLUENZA VACCINE  Completed   Hepatitis C Screening  Completed    HPV VACCINES  Aged Out    Health Maintenance  Health Maintenance Due  Topic Date Due   COVID-19 Vaccine (7 - 2023-24 season) 06/28/2023    Colorectal cancer screening: Type of screening: Colonoscopy. Completed 06/13/2015. Repeat every 10 years  Lung Cancer Screening: (Low Dose CT Chest recommended if Age 30-80 years, 20 pack-year currently smoking OR have quit w/in 15years.) does not qualify.   Lung Cancer Screening Referral: n/a  Additional Screening:  Hepatitis C Screening: does not qualify; Completed 12/15/2013  Vision Screening: Recommended annual ophthalmology exams for early detection of glaucoma and other disorders of the eye. Is the patient up to date with their annual eye exam?  Yes  Who is the provider or what is the name of the office in which the patient attends annual eye exams? Dr.Le  If pt is not established with a provider, would they like to be referred to a provider to establish care? No .   Dental Screening: Recommended annual dental exams for proper oral hygiene   Community Resource Referral / Chronic Care Management: CRR required this visit?  No   CCM required this visit?  No    Plan:     I have personally reviewed and noted the following in the patient's chart:   Medical and social history Use of alcohol, tobacco or illicit drugs  Current medications and supplements including opioid prescriptions. Patient is not currently taking opioid prescriptions. Functional ability and status Nutritional status Physical activity Advanced directives List of other physicians Hospitalizations, surgeries, and ER visits in previous 12 months Vitals Screenings to include cognitive, depression, and falls Referrals and appointments  In addition, I have reviewed and discussed with patient certain preventive protocols, quality metrics, and best practice recommendations. A written personalized care plan for preventive services as well as general preventive health  recommendations were provided to patient.     Lorrene Reid, LPN   [id number]   After Visit Summary: (MyChart) Due to this being a telephonic visit, the after visit summary with patients personalized plan was offered to patient via MyChart   Nurse Notes: none

## 2023-09-02 NOTE — Patient Instructions (Signed)
James Solis , Thank you for taking time to come for your Medicare Wellness Visit. I appreciate your ongoing commitment to your health goals. Please review the following plan we discussed and let me know if I can assist you in the future.   Referrals/Orders/Follow-Ups/Clinician Recommendations: Aim for 30 minutes of exercise or brisk walking, 6-8 glasses of water, and 5 servings of fruits and vegetables each day.   This is a list of the screening recommended for you and due dates:  Health Maintenance  Topic Date Due   COVID-19 Vaccine (7 - 2023-24 season) 06/28/2023   Zoster (Shingles) Vaccine (1 of 2) 10/16/2023*   Medicare Annual Wellness Visit  09/01/2024   Colon Cancer Screening  06/12/2025   DTaP/Tdap/Td vaccine (3 - Td or Tdap) 05/21/2028   Pneumonia Vaccine  Completed   Flu Shot  Completed   Hepatitis C Screening  Completed   HPV Vaccine  Aged Out  *Topic was postponed. The date shown is not the original due date.    Advanced directives: (Copy Requested) Please bring a copy of your health care power of attorney and living will to the office to be added to your chart at your convenience.  Next Medicare Annual Wellness Visit scheduled for next year: Yes  insert Preventive Care attachment Insert FALL PREVENTION attachment if needed

## 2023-12-17 ENCOUNTER — Ambulatory Visit: Payer: PRIVATE HEALTH INSURANCE | Admitting: Family

## 2023-12-18 ENCOUNTER — Ambulatory Visit (INDEPENDENT_AMBULATORY_CARE_PROVIDER_SITE_OTHER): Payer: Medicare HMO | Admitting: Family

## 2023-12-18 ENCOUNTER — Other Ambulatory Visit: Payer: Self-pay | Admitting: Family

## 2023-12-18 VITALS — BP 138/70 | HR 64 | Temp 97.8°F | Ht 74.0 in | Wt 288.4 lb

## 2023-12-18 DIAGNOSIS — R338 Other retention of urine: Secondary | ICD-10-CM

## 2023-12-18 DIAGNOSIS — N401 Enlarged prostate with lower urinary tract symptoms: Secondary | ICD-10-CM | POA: Diagnosis not present

## 2023-12-18 DIAGNOSIS — E785 Hyperlipidemia, unspecified: Secondary | ICD-10-CM | POA: Diagnosis not present

## 2023-12-18 DIAGNOSIS — Z713 Dietary counseling and surveillance: Secondary | ICD-10-CM

## 2023-12-18 DIAGNOSIS — Z0000 Encounter for general adult medical examination without abnormal findings: Secondary | ICD-10-CM

## 2023-12-18 DIAGNOSIS — Z0001 Encounter for general adult medical examination with abnormal findings: Secondary | ICD-10-CM | POA: Diagnosis not present

## 2023-12-18 DIAGNOSIS — E669 Obesity, unspecified: Secondary | ICD-10-CM

## 2023-12-18 DIAGNOSIS — I10 Essential (primary) hypertension: Secondary | ICD-10-CM | POA: Diagnosis not present

## 2023-12-18 MED ORDER — SEMAGLUTIDE-WEIGHT MANAGEMENT 0.5 MG/0.5ML ~~LOC~~ SOAJ
0.5000 mg | SUBCUTANEOUS | 1 refills | Status: AC
Start: 2024-01-16 — End: 2024-02-13

## 2023-12-18 MED ORDER — SEMAGLUTIDE-WEIGHT MANAGEMENT 0.25 MG/0.5ML ~~LOC~~ SOAJ
0.2500 mg | SUBCUTANEOUS | 0 refills | Status: AC
Start: 2023-12-18 — End: 2024-01-15

## 2023-12-18 NOTE — Progress Notes (Signed)
 Subjective:    Patient ID: James Ben., male    DOB: [birth year], 73 y.o.   MRN: [id number]  Chief Complaint  Patient presents with   Weight Loss   PT presents to the office today for CPE and chronic follow up.    He is morbid obese with BMI of 37 with HTN and hyperlipidemia.   He retired, but has recently went back to work for 38 hours a week.   Complaining of feeling claustrophobic and driving over heights. He states this has started disrupting his life because he will look at maps before driving and avoid areas.  Hypertension This is a chronic problem. The current episode started more than 1 year ago. The problem has been resolved since onset. The problem is controlled. Associated symptoms include peripheral edema (trace) and shortness of breath. Pertinent negatives include no malaise/fatigue. Risk factors for coronary artery disease include obesity and male gender. The current treatment provides moderate improvement.  Hyperlipidemia This is a chronic problem. The current episode started more than 1 year ago. The problem is controlled. Recent lipid tests were reviewed and are normal. Exacerbating diseases include obesity. Associated symptoms include shortness of breath. Current antihyperlipidemic treatment includes statins. The current treatment provides moderate improvement of lipids. Risk factors for coronary artery disease include dyslipidemia, hypertension, a sedentary lifestyle, obesity and male sex.  Benign Prostatic Hypertrophy This is a chronic problem. The current episode started more than 1 year ago. Irritative symptoms include nocturia (3). Past treatments include tamsulosin and terazosin. The treatment provided moderate relief.      Review of Systems  Constitutional:  Negative for malaise/fatigue.  Respiratory:  Positive for shortness of breath.   Genitourinary:  Positive for nocturia (3).  All other systems reviewed and are negative.  Family History  Problem  Relation Age of Onset   Asthma Mother    Coronary artery disease Father        x3 Bypasses    Hypertension Other    Colon cancer Neg Hx    Social History   Socioeconomic History   Marital status: Married    Spouse name: Not on file   Number of children: Not on file   Years of education: Not on file   Highest education level: Associate degree: occupational, Scientist, product/process development, or vocational program  Occupational History   Not on file  Tobacco Use   Smoking status: Never   Smokeless tobacco: Never  Vaping Use   Vaping status: Never Used  Substance and Sexual Activity   Alcohol use: No    Alcohol/week: 0.0 standard drinks of alcohol   Drug use: No   Sexual activity: Not on file  Other Topics Concern   Not on file  Social History Narrative   Not on file   Social Drivers of Health   Financial Resource Strain: Low Risk  (12/18/2023)   Overall Financial Resource Strain (CARDIA)    Difficulty of Paying Living Expenses: Not hard at all  Food Insecurity: No Food Insecurity (12/18/2023)   Hunger Vital Sign    Worried About Running Out of Food in the Last Year: Never true    Ran Out of Food in the Last Year: Never true  Transportation Needs: No Transportation Needs (12/18/2023)   PRAPARE - Administrator, Civil Service (Medical): No    Lack of Transportation (Non-Medical): No  Physical Activity: Inactive (12/18/2023)   Exercise Vital Sign    Days of Exercise per Week: 0 days  Minutes of Exercise per Session: 30 min  Stress: No Stress Concern Present (12/18/2023)   [employer] of Occupational Health - Occupational Stress Questionnaire    Feeling of Stress : Only a little  Social Connections: Moderately Integrated (12/18/2023)   Social Connection and Isolation Panel [NHANES]    Frequency of Communication with Friends and Family: More than three times a week    Frequency of Social Gatherings with Friends and Family: Twice a week    Attends Religious Services: 1 to 4 times  per year    Active Member of [employer] or Organizations: No    Attends Banker Meetings: Never    Marital Status: Married       Objective:   Physical Exam Vitals reviewed.  Constitutional:      General: He is not in acute distress.    Appearance: He is well-developed. He is obese.  HENT:     Head: Normocephalic.     Right Ear: Tympanic membrane and external ear normal.     Left Ear: Tympanic membrane and external ear normal.  Eyes:     General:        Right eye: No discharge.        Left eye: No discharge.     Pupils: Pupils are equal, round, and reactive to light.  Neck:     Thyroid: No thyromegaly.  Cardiovascular:     Rate and Rhythm: Normal rate and regular rhythm.     Heart sounds: Normal heart sounds. No murmur heard. Pulmonary:     Effort: Pulmonary effort is normal. No respiratory distress.     Breath sounds: Normal breath sounds. No wheezing.  Abdominal:     General: Bowel sounds are normal. There is no distension.     Palpations: Abdomen is soft.     Tenderness: There is no abdominal tenderness.  Musculoskeletal:        General: No tenderness. Normal range of motion.     Cervical back: Normal range of motion and neck supple.  Skin:    General: Skin is warm and dry.     Findings: No erythema or rash.  Neurological:     Mental Status: He is alert and oriented to person, place, and time.     Cranial Nerves: No cranial nerve deficit.     Deep Tendon Reflexes: Reflexes are normal and symmetric.  Psychiatric:        Behavior: Behavior normal.        Thought Content: Thought content normal.        Judgment: Judgment normal.       BP 138/70   Pulse 64   Temp 97.8 F (36.6 C)   Ht 6\' 2"  (1.88 m)   Wt 288 lb 6.4 oz (130.8 kg)   SpO2 95%   BMI 37.03 kg/m      Assessment & Plan:  James Ben. comes in today with chief complaint of Weight Loss   Diagnosis and orders addressed:  1. Annual physical exam (Primary) - CMP14+EGFR - CBC with  Differential/Platelet - Lipid panel - PSA, total and free  2. Hyperlipidemia, unspecified hyperlipidemia type  3. Primary hypertension  4. Morbid obesity (HCC) - Semaglutide-Weight Management 0.25 MG/0.5ML SOAJ; Inject 0.25 mg into the skin once a week for 28 days.  Dispense: 2 mL; Refill: 0 - Semaglutide-Weight Management 0.5 MG/0.5ML SOAJ; Inject 0.5 mg into the skin once a week for 28 days.  Dispense: 2 mL; Refill: 1  5.  Urinary retention due to benign prostatic hyperplasia  6. Weight loss counseling, encounter for - Semaglutide-Weight Management 0.25 MG/0.5ML SOAJ; Inject 0.25 mg into the skin once a week for 28 days.  Dispense: 2 mL; Refill: 0 - Semaglutide-Weight Management 0.5 MG/0.5ML SOAJ; Inject 0.5 mg into the skin once a week for 28 days.  Dispense: 2 mL; Refill: 1   Labs pending Will order Wegovy today, I do not think insurance will cover. However, patient states he was told it would. Encourage healthy diet and exercise  Health Maintenance reviewed Diet and exercise encouraged  Follow up plan: 2 months if Reginal Lutes is approved    Jannifer Rodney, FNP

## 2023-12-18 NOTE — Telephone Encounter (Signed)
 WEGOVY 0.25 MG/0.5ML SOAJ   Pharmacy comment: Alternative Requested:NOT COVERED.

## 2023-12-18 NOTE — Patient Instructions (Signed)

## 2023-12-19 LAB — CBC WITH DIFFERENTIAL/PLATELET
Basophils Absolute: 0 10*3/uL (ref 0.0–0.2)
Basos: 1 %
EOS (ABSOLUTE): 0.2 10*3/uL (ref 0.0–0.4)
Eos: 3 %
Hematocrit: 47.4 % (ref 37.5–51.0)
Hemoglobin: 15.8 g/dL (ref 13.0–17.7)
Immature Grans (Abs): 0 10*3/uL (ref 0.0–0.1)
Immature Granulocytes: 0 %
Lymphocytes Absolute: 1.3 10*3/uL (ref 0.7–3.1)
Lymphs: 24 %
MCH: 29.8 pg (ref 26.6–33.0)
MCHC: 33.3 g/dL (ref 31.5–35.7)
MCV: 89 fL (ref 79–97)
Monocytes Absolute: 0.3 10*3/uL (ref 0.1–0.9)
Monocytes: 7 %
Neutrophils Absolute: 3.4 10*3/uL (ref 1.4–7.0)
Neutrophils: 65 %
Platelets: 172 10*3/uL (ref 150–450)
RBC: 5.31 x10E6/uL (ref 4.14–5.80)
RDW: 13.2 % (ref 11.6–15.4)
WBC: 5.2 10*3/uL (ref 3.4–10.8)

## 2023-12-19 LAB — LIPID PANEL
Chol/HDL Ratio: 2.5 ratio (ref 0.0–5.0)
Cholesterol, Total: 98 mg/dL — ABNORMAL LOW (ref 100–199)
HDL: 39 mg/dL — ABNORMAL LOW (ref >39)
LDL Chol Calc (NIH): 41 mg/dL (ref 0–99)
Triglycerides: 94 mg/dL (ref 0–149)
VLDL Cholesterol Cal: 18 mg/dL (ref 5–40)

## 2023-12-19 LAB — CMP14+EGFR
ALT: 23 IU/L (ref 0–44)
AST: 18 IU/L (ref 0–40)
Albumin: 4.4 g/dL (ref 3.9–4.9)
Alkaline Phosphatase: 76 IU/L (ref 44–121)
BUN/Creatinine Ratio: 15 (ref 10–24)
BUN: 16 mg/dL (ref 8–27)
Bilirubin Total: 0.8 mg/dL (ref 0.0–1.2)
CO2: 24 mmol/L (ref 20–29)
Calcium: 9.1 mg/dL (ref 8.6–10.2)
Chloride: 103 mmol/L (ref 96–106)
Creatinine, Ser: 1.04 mg/dL (ref 0.76–1.27)
Globulin, Total: 2.1 g/dL (ref 1.5–4.5)
Glucose: 95 mg/dL (ref 70–99)
Potassium: 3.7 mmol/L (ref 3.5–5.2)
Sodium: 141 mmol/L (ref 134–144)
Total Protein: 6.5 g/dL (ref 6.0–8.5)
eGFR: 77 mL/min/{1.73_m2} (ref >59)

## 2023-12-19 LAB — PSA, TOTAL AND FREE
PSA, Free Pct: 45.3 %
PSA, Free: 0.86 ng/mL
Prostate Specific Ag, Serum: 1.9 ng/mL (ref 0.0–4.0)

## 2023-12-21 MED ORDER — PHENTERMINE HCL 37.5 MG PO TABS: 37.5000 mg | ORAL_TABLET | Freq: Every day | ORAL | 2 refills | Status: DC

## 2024-01-14 ENCOUNTER — Ambulatory Visit: Payer: PRIVATE HEALTH INSURANCE | Admitting: Family

## 2024-01-16 ENCOUNTER — Other Ambulatory Visit: Payer: Self-pay | Admitting: Family

## 2024-01-16 DIAGNOSIS — F40240 Claustrophobia: Secondary | ICD-10-CM

## 2024-01-16 DIAGNOSIS — I10 Essential (primary) hypertension: Secondary | ICD-10-CM

## 2024-01-16 DIAGNOSIS — N401 Enlarged prostate with lower urinary tract symptoms: Secondary | ICD-10-CM

## 2024-01-17 ENCOUNTER — Other Ambulatory Visit: Payer: Self-pay | Admitting: Family

## 2024-01-17 DIAGNOSIS — R338 Other retention of urine: Secondary | ICD-10-CM

## 2024-02-04 ENCOUNTER — Other Ambulatory Visit: Payer: Self-pay | Admitting: Family

## 2024-02-04 DIAGNOSIS — I10 Essential (primary) hypertension: Secondary | ICD-10-CM

## 2024-04-19 ENCOUNTER — Other Ambulatory Visit: Payer: Self-pay | Admitting: Family

## 2024-04-19 DIAGNOSIS — I10 Essential (primary) hypertension: Secondary | ICD-10-CM

## 2024-04-19 DIAGNOSIS — N401 Enlarged prostate with lower urinary tract symptoms: Secondary | ICD-10-CM

## 2024-04-19 DIAGNOSIS — E785 Hyperlipidemia, unspecified: Secondary | ICD-10-CM

## 2024-04-19 NOTE — Telephone Encounter (Signed)
 Apt 06/14/2024

## 2024-04-19 NOTE — Telephone Encounter (Signed)
 [name] NTBS in Aug for 6 mos FU RFs sent to pharmacy

## 2024-05-14 ENCOUNTER — Other Ambulatory Visit: Payer: Self-pay | Admitting: Family

## 2024-05-14 DIAGNOSIS — I10 Essential (primary) hypertension: Secondary | ICD-10-CM

## 2024-06-14 ENCOUNTER — Ambulatory Visit (INDEPENDENT_AMBULATORY_CARE_PROVIDER_SITE_OTHER): Payer: PRIVATE HEALTH INSURANCE | Admitting: Family

## 2024-06-14 ENCOUNTER — Encounter: Payer: Self-pay | Admitting: Family

## 2024-06-14 VITALS — BP 132/81 | HR 63 | Temp 97.7°F | Ht 74.0 in | Wt 287.4 lb

## 2024-06-14 DIAGNOSIS — R338 Other retention of urine: Secondary | ICD-10-CM

## 2024-06-14 DIAGNOSIS — F40240 Claustrophobia: Secondary | ICD-10-CM | POA: Diagnosis not present

## 2024-06-14 DIAGNOSIS — I10 Essential (primary) hypertension: Secondary | ICD-10-CM

## 2024-06-14 DIAGNOSIS — N401 Enlarged prostate with lower urinary tract symptoms: Secondary | ICD-10-CM | POA: Diagnosis not present

## 2024-06-14 DIAGNOSIS — E785 Hyperlipidemia, unspecified: Secondary | ICD-10-CM

## 2024-06-14 LAB — CMP14+EGFR
ALT: 19 IU/L (ref 0–44)
AST: 21 IU/L (ref 0–40)
Albumin: 4.1 g/dL (ref 3.9–4.9)
Alkaline Phosphatase: 80 IU/L (ref 44–121)
BUN/Creatinine Ratio: 12 (ref 10–24)
BUN: 15 mg/dL (ref 8–27)
Bilirubin Total: 0.8 mg/dL (ref 0.0–1.2)
CO2: 24 mmol/L (ref 20–29)
Calcium: 9.2 mg/dL (ref 8.6–10.2)
Chloride: 104 mmol/L (ref 96–106)
Creatinine, Ser: 1.29 mg/dL — ABNORMAL HIGH (ref 0.76–1.27)
Globulin, Total: 2 g/dL (ref 1.5–4.5)
Glucose: 92 mg/dL (ref 70–99)
Potassium: 3.8 mmol/L (ref 3.5–5.2)
Sodium: 143 mmol/L (ref 134–144)
Total Protein: 6.1 g/dL (ref 6.0–8.5)
eGFR: 60 mL/min/1.73 (ref >59)

## 2024-06-14 MED ORDER — METOPROLOL SUCCINATE ER 50 MG PO TB24: ORAL_TABLET | ORAL | 1 refills | Status: AC

## 2024-06-14 MED ORDER — ATORVASTATIN CALCIUM 20 MG PO TABS: 20.0000 mg | ORAL_TABLET | Freq: Every day | ORAL | 2 refills | Status: AC

## 2024-06-14 MED ORDER — AMLODIPINE BESYLATE 10 MG PO TABS: 10.0000 mg | ORAL_TABLET | Freq: Every day | ORAL | 2 refills | Status: AC

## 2024-06-14 MED ORDER — LOSARTAN POTASSIUM 100 MG PO TABS: 100.0000 mg | ORAL_TABLET | Freq: Every day | ORAL | 0 refills | Status: DC

## 2024-06-14 MED ORDER — TAMSULOSIN HCL 0.4 MG PO CAPS: 0.8000 mg | ORAL_CAPSULE | Freq: Every day | ORAL | 2 refills | Status: AC

## 2024-06-14 MED ORDER — TERAZOSIN HCL 10 MG PO CAPS: 10.0000 mg | ORAL_CAPSULE | Freq: Every day | ORAL | 1 refills | Status: AC

## 2024-06-14 NOTE — Patient Instructions (Signed)

## 2024-06-14 NOTE — Progress Notes (Signed)
 Subjective:    Patient ID: James Solis., male    DOB: June 08, 1951, 73 y.o.   MRN: [id number]  Chief Complaint  Patient presents with   Medical Management of Chronic Issues   PT presents to the office today for chronic follow up.    He is morbid obese with BMI of 36 with HTN and hyperlipidemia.   He is retired.  Complaining of feeling claustrophobic and driving over heights. He states this has started disrupting his life because he will look at maps before driving and avoid areas.  Hypertension This is a chronic problem. The current episode started more than 1 year ago. The problem has been waxing and waning since onset. The problem is uncontrolled. Associated symptoms include shortness of breath. Pertinent negatives include no malaise/fatigue, orthopnea or peripheral edema. Risk factors for coronary artery disease include obesity and male gender. The current treatment provides moderate improvement.  Hyperlipidemia This is a chronic problem. The current episode started more than 1 year ago. The problem is controlled. Recent lipid tests were reviewed and are normal. Exacerbating diseases include obesity. Associated symptoms include shortness of breath. Current antihyperlipidemic treatment includes statins. The current treatment provides moderate improvement of lipids. Risk factors for coronary artery disease include dyslipidemia, hypertension, a sedentary lifestyle, obesity and male sex.  Benign Prostatic Hypertrophy This is a chronic problem. The current episode started more than 1 year ago. Irritative symptoms include nocturia (3). Past treatments include tamsulosin  and terazosin . The treatment provided moderate relief.      Review of Systems  Constitutional:  Negative for malaise/fatigue.  Respiratory:  Positive for shortness of breath.   Cardiovascular:  Negative for orthopnea.  Genitourinary:  Positive for nocturia (3).  All other systems reviewed and are negative.  Family  History  Problem Relation Age of Onset   Asthma Mother    Coronary artery disease Father        x3 Bypasses    Hypertension Other    Colon cancer Neg Hx    Social History   Socioeconomic History   Marital status: Married    Spouse name: Not on file   Number of children: Not on file   Years of education: Not on file   Highest education level: Associate degree: occupational, Scientist, product/process development, or vocational program  Occupational History   Not on file  Tobacco Use   Smoking status: Never   Smokeless tobacco: Never  Vaping Use   Vaping status: Never Used  Substance and Sexual Activity   Alcohol use: No    Alcohol/week: 0.0 standard drinks of alcohol   Drug use: No   Sexual activity: Not on file  Other Topics Concern   Not on file  Social History Narrative   Not on file   Social Drivers of Health   Financial Resource Strain: Low Risk  (06/10/2024)   Overall Financial Resource Strain (CARDIA)    Difficulty of Paying Living Expenses: Not hard at all  Food Insecurity: No Food Insecurity (06/10/2024)   Hunger Vital Sign    Worried About Running Out of Food in the Last Year: Never true    Ran Out of Food in the Last Year: Never true  Transportation Needs: No Transportation Needs (06/10/2024)   PRAPARE - Administrator, Civil Service (Medical): No    Lack of Transportation (Non-Medical): No  Physical Activity: Insufficiently Active (06/10/2024)   Exercise Vital Sign    Days of Exercise per Week: 1 day    Minutes  of Exercise per Session: 20 min  Stress: No Stress Concern Present (06/10/2024)   [employer] of Occupational Health - Occupational Stress Questionnaire    Feeling of Stress: Not at all  Social Connections: Moderately Integrated (06/10/2024)   Social Connection and Isolation Panel    Frequency of Communication with Friends and Family: More than three times a week    Frequency of Social Gatherings with Friends and Family: More than three times a week    Attends  Religious Services: 1 to 4 times per year    Active Member of [employer] or Organizations: No    Attends Engineer, structural: Not on file    Marital Status: Married       Objective:   Physical Exam Vitals reviewed.  Constitutional:      General: He is not in acute distress.    Appearance: He is well-developed. He is obese.  HENT:     Head: Normocephalic.     Right Ear: Tympanic membrane normal.     Left Ear: Tympanic membrane normal.  Eyes:     General:        Right eye: No discharge.        Left eye: No discharge.     Pupils: Pupils are equal, round, and reactive to light.  Neck:     Thyroid: No thyromegaly.  Cardiovascular:     Rate and Rhythm: Normal rate and regular rhythm.     Heart sounds: Normal heart sounds. No murmur heard. Pulmonary:     Effort: Pulmonary effort is normal. No respiratory distress.     Breath sounds: Normal breath sounds. No wheezing.  Abdominal:     General: Bowel sounds are normal. There is no distension.     Palpations: Abdomen is soft.     Tenderness: There is no abdominal tenderness.  Musculoskeletal:        General: No tenderness. Normal range of motion.     Cervical back: Normal range of motion and neck supple.  Skin:    General: Skin is warm and dry.     Findings: No erythema or rash.  Neurological:     Mental Status: He is alert and oriented to person, place, and time.     Cranial Nerves: No cranial nerve deficit.     Deep Tendon Reflexes: Reflexes are normal and symmetric.  Psychiatric:        Behavior: Behavior normal.        Thought Content: Thought content normal.        Judgment: Judgment normal.       BP 132/81   Pulse 63   Temp 97.7 F (36.5 C) (Temporal)   Ht 6' 2 (1.88 m)   Wt 287 lb 6.4 oz (130.4 kg)   SpO2 97%   BMI 36.90 kg/m      Assessment & Plan:  James Solis. comes in today with chief complaint of Medical Management of Chronic Issues   Diagnosis and orders addressed:  1. Primary  hypertension (Primary) - amLODipine  (NORVASC ) 10 MG tablet; Take 1 tablet (10 mg total) by mouth daily.  Dispense: 90 tablet; Refill: 2 - losartan  (COZAAR ) 100 MG tablet; Take 1 tablet (100 mg total) by mouth daily.  Dispense: 90 tablet; Refill: 0 - metoprolol  succinate (TOPROL -XL) 50 MG 24 hr tablet; TAKE 1 TABLET BY MOUTH DAILY WITH OR IMMEDIATELY FOLLOWING A MEAL.  Dispense: 90 tablet; Refill: 1 - terazosin  (HYTRIN ) 10 MG capsule; Take 1 capsule (10 mg  total) by mouth at bedtime.  Dispense: 90 capsule; Refill: 1 - CMP14+EGFR  2. Hyperlipidemia, unspecified hyperlipidemia type - atorvastatin  (LIPITOR) 20 MG tablet; Take 1 tablet (20 mg total) by mouth daily.  Dispense: 90 tablet; Refill: 2 - CMP14+EGFR  3. Urinary retention due to benign prostatic hyperplasia - tamsulosin  (FLOMAX ) 0.4 MG CAPS capsule; Take 2 capsules (0.8 mg total) by mouth daily.  Dispense: 180 capsule; Refill: 2 - terazosin  (HYTRIN ) 10 MG capsule; Take 1 capsule (10 mg total) by mouth at bedtime.  Dispense: 90 capsule; Refill: 1 - CMP14+EGFR  4. Claustrophobia  - CMP14+EGFR  5. Obesity, morbid (HCC) - CMP14+EGFR   Labs pending Continue current medications  Encourage healthy diet and exercise  Health Maintenance reviewed Diet and exercise encouraged  Follow up plan: 6 months    Bari Learn, FNP

## 2024-06-16 ENCOUNTER — Ambulatory Visit: Payer: Self-pay | Admitting: Family

## 2024-08-31 DIAGNOSIS — D225 Melanocytic nevi of trunk: Secondary | ICD-10-CM | POA: Diagnosis not present

## 2024-08-31 DIAGNOSIS — D485 Neoplasm of uncertain behavior of skin: Secondary | ICD-10-CM | POA: Diagnosis not present

## 2024-08-31 DIAGNOSIS — L280 Lichen simplex chronicus: Secondary | ICD-10-CM | POA: Diagnosis not present

## 2024-08-31 DIAGNOSIS — Z1283 Encounter for screening for malignant neoplasm of skin: Secondary | ICD-10-CM | POA: Diagnosis not present

## 2024-09-05 ENCOUNTER — Ambulatory Visit: Payer: PRIVATE HEALTH INSURANCE

## 2024-09-05 VITALS — BP 132/81 | HR 63 | Ht 74.0 in | Wt 287.0 lb

## 2024-09-05 DIAGNOSIS — Z0000 Encounter for general adult medical examination without abnormal findings: Secondary | ICD-10-CM | POA: Diagnosis not present

## 2024-09-05 NOTE — Progress Notes (Signed)
 Subjective:   James Solis. is a 73 y.o. male who presents for a Medicare Annual Wellness Visit.  I connected with  James Solis. on 09/05/24 by a audio enabled telemedicine application and verified that I am speaking with the correct person using two identifiers.  Patient Location: Home  Provider Location: Home Office  I discussed the limitations of evaluation and management by telemedicine. The patient expressed understanding and agreed to proceed.   Allergies (verified) Patient has no known allergies.   History: Past Medical History:  Diagnosis Date   Cataract 10/27/20   Removed on right eye   Cough    Gout    Hyperlipidemia    Hypertension    Nasal congestion    Rash    bilateral legs    Sinusitis    Past Surgical History:  Procedure Laterality Date   COLONOSCOPY  10/27/2004   [facility] hospital, unknown md, normal   EYE SURGERY  Jan.2022   Detched retina   HERNIA REPAIR  2006   INGUINAL HERNIA REPAIR  10/28/1995   Family History  Problem Relation Age of Onset   Asthma Mother    Coronary artery disease Father        x3 Bypasses    Hypertension Other    Colon cancer Neg Hx    Social History   Occupational History   Not on file  Tobacco Use   Smoking status: Never   Smokeless tobacco: Never  Vaping Use   Vaping status: Never Used  Substance and Sexual Activity   Alcohol use: No    Alcohol/week: 0.0 standard drinks of alcohol   Drug use: No   Sexual activity: Not on file   Tobacco Counseling Counseling given: Yes  SDOH Screenings   Food Insecurity: No Food Insecurity (09/05/2024)  Housing: Unknown (09/05/2024)  Transportation Needs: No Transportation Needs (09/05/2024)  Utilities: Not At Risk (09/05/2024)  Alcohol Screen: Low Risk  (09/02/2023)  Depression (PHQ2-9): Low Risk  (09/05/2024)  Financial Resource Strain: Low Risk  (06/10/2024)  Physical Activity: Insufficiently Active (09/05/2024)  Social Connections: Moderately Integrated  (09/05/2024)  Stress: No Stress Concern Present (09/05/2024)  Tobacco Use: Low Risk  (09/05/2024)  Health Literacy: Adequate Health Literacy (09/05/2024)   Depression Screen    09/05/2024    8:50 AM 06/14/2024    8:42 AM 12/18/2023    8:47 AM 09/02/2023   11:04 AM 07/17/2023   10:39 AM 12/16/2022   10:01 AM 08/28/2022    3:20 PM  PHQ 2/9 Scores  PHQ - 2 Score 0 0 0 0 0 0 0  PHQ- 9 Score  1  0  0  0  0  0      Data saved with a previous flowsheet row definition     Goals Addressed             This Visit's Progress    Patient Stated   On track    08/28/2022 AWV Goal: Exercise for General Health  Patient will verbalize understanding of the benefits of increased physical activity: Exercising regularly is important. It will improve your overall fitness, flexibility, and endurance. Regular exercise also will improve your overall health. It can help you control your weight, reduce stress, and improve your bone density. Over the next year, patient will increase physical activity as tolerated with a goal of at least 150 minutes of moderate physical activity per week.  You can tell that you are exercising at a moderate intensity if your  heart starts beating faster and you start breathing faster but can still hold a conversation. Moderate-intensity exercise ideas include: Walking 1 mile (1.6 km) in about 15 minutes Biking Hiking Golfing Dancing Water aerobics Patient will verbalize understanding of everyday activities that increase physical activity by providing examples like the following: Yard work, such as: Insurance Underwriter Gardening Washing windows or floors Patient will be able to explain general safety guidelines for exercising:  Before you start a new exercise program, talk with your health care provider. Do not exercise so much that you hurt yourself, feel dizzy, or get very short of  breath. Wear comfortable clothes and wear shoes with good support. Drink plenty of water while you exercise to prevent dehydration or heat stroke. Work out until your breathing and your heartbeat get faster.        Visit info / Clinical Intake: Medicare Wellness Visit Type:: Subsequent Annual Wellness Visit Medicare Wellness Visit Mode:: Telephone If telephone:: video declined If telephone or video:: vitals recorded from last visit Interpreter Needed?: No Pre-visit prep was completed: yes AWV questionnaire completed by patient prior to visit?: no Living arrangements:: lives with spouse/significant other Patient's Overall Health Status Rating: very good Typical amount of pain: none Does pain affect daily life?: no Are you currently prescribed opioids?: no  Dietary Habits and Nutritional Risks How many meals a day?: 2 Eats fruit and vegetables daily?: yes Most meals are obtained by: preparing own meals Diabetic:: no  Functional Status Activities of Daily Living (to include ambulation/medication): Independent Ambulation: Independent Medication Administration: Independent Home Management: Independent Manage your own finances?: yes Primary transportation is: driving Concerns about hearing?: no  Fall Screening Falls in the past year?: 0 Number of falls in past year: 0 Was there an injury with Fall?: 0 Fall Risk Category Calculator: 0 Patient Fall Risk Level: Low Fall Risk  Fall Risk Patient at Risk for Falls Due to: No Fall Risks Fall risk Follow up: Falls evaluation completed; Education provided  Home and Transportation Safety: All rugs have non-skid backing?: yes All stairs or steps have railings?: yes Grab bars in the bathtub or shower?: (!) no Have non-skid surface in bathtub or shower?: yes Good home lighting?: yes Regular seat belt use?: yes Hospital stays in the last year:: no  Cognitive Assessment Difficulty concentrating, remembering, or making decisions?  : no Will 6CIT or Mini Cog be Completed: yes What year is it?: 0 points What month is it?: 0 points Give patient an address phrase to remember (5 components): [address] [place], [place] About what time is it?: 0 points Count backwards from 20 to 1: 0 points Say the months of the year in reverse: 0 points Repeat the address phrase from earlier: 0 points 6 CIT Score: 0 points  Advance Directives (For Healthcare) Does Patient Have a Medical Advance Directive?: Yes Type of Advance Directive: Healthcare Power of [place]; Living will Copy of Healthcare Power of Attorney in Chart?: No - copy requested Copy of Living Will in Chart?: No - copy requested Would patient like information on creating a medical advance directive?: -- (pt is aware to pick up info at pcp's office)  Reviewed/Updated  Reviewed/Updated: All; Medical History; Surgical History; Family History; Medications; Allergies; Care Teams; Patient Goals        Objective:    Today's Vitals   09/05/24 0841  BP: 132/81  Pulse: 63  Weight: 287 lb (130.2 kg)  Height: 6' 2 (1.88 m)   Body mass index is 36.85 kg/m.  Current Medications (verified) Outpatient Encounter Medications as of 09/05/2024  Medication Sig   amLODipine  (NORVASC ) 10 MG tablet Take 1 tablet (10 mg total) by mouth daily.   atorvastatin  (LIPITOR) 20 MG tablet Take 1 tablet (20 mg total) by mouth daily.   fluocinonide  ointment (LIDEX ) 0.05 % APPLY TO AFFECTED AREA TWICE A DAY   losartan  (COZAAR ) 100 MG tablet Take 1 tablet (100 mg total) by mouth daily.   metoprolol  succinate (TOPROL -XL) 50 MG 24 hr tablet TAKE 1 TABLET BY MOUTH DAILY WITH OR IMMEDIATELY FOLLOWING A MEAL.   tamsulosin  (FLOMAX ) 0.4 MG CAPS capsule Take 2 capsules (0.8 mg total) by mouth daily.   terazosin  (HYTRIN ) 10 MG capsule Take 1 capsule (10 mg total) by mouth at bedtime.   No facility-administered encounter medications on file as of 09/05/2024.   Hearing/Vision screen Hearing Screening -  Comments:: Pt denies hearing dif Vision Screening - Comments:: Pt use reading glasses/pt goes to [facility] in [place], [place]/last ov 2-62yrs ago Immunizations and Health Maintenance Health Maintenance  Topic Date Due   COVID-19 Vaccine (7 - 2025-26 season) 06/27/2024   Zoster Vaccines- Shingrix (1 of 2) 09/14/2024 (Originally 06/27/2003)   Influenza Vaccine  01/24/2025 (Originally 05/27/2024)   Colonoscopy  06/12/2025   Medicare Annual Wellness (AWV)  09/05/2025   DTaP/Tdap/Td (3 - Td or Tdap) 05/21/2028   Pneumococcal Vaccine: 50+ Years  Completed   Hepatitis C Screening  Completed   Meningococcal B Vaccine  Aged Out        Assessment/Plan:  This is a routine wellness examination for [place].  Patient Care Team: Lavell Bari LABOR, FNP as PCP - General (Family Medicine)  I have personally reviewed and noted the following in the patient's chart:   Medical and social history Use of alcohol, tobacco or illicit drugs  Current medications and supplements including opioid prescriptions. Functional ability and status Nutritional status Physical activity Advanced directives List of other physicians Hospitalizations, surgeries, and ER visits in previous 12 months Vitals Screenings to include cognitive, depression, and falls Referrals and appointments  No orders of the defined types were placed in this encounter.  In addition, I have reviewed and discussed with patient certain preventive protocols, quality metrics, and best practice recommendations. A written personalized care plan for preventive services as well as general preventive health recommendations were provided to patient.   James Solis, CMA   09/05/2024   Return in 1 year (on 09/05/2025).  After Visit Summary: (MyChart) Due to this being a telephonic visit, the after visit summary with patients personalized plan was offered to patient via MyChart   Nurse Notes: n/a

## 2024-09-24 ENCOUNTER — Emergency Department (HOSPITAL_COMMUNITY)

## 2024-09-24 ENCOUNTER — Emergency Department (HOSPITAL_COMMUNITY)
Admission: EM | Admit: 2024-09-24 | Discharge: 2024-09-24 | Disposition: A | Discharge status: Home / Self Care | Attending: Emergency Medicine | Admitting: Emergency Medicine

## 2024-09-24 ENCOUNTER — Encounter (HOSPITAL_COMMUNITY): Payer: Self-pay | Admitting: Emergency Medicine

## 2024-09-24 ENCOUNTER — Other Ambulatory Visit: Payer: Self-pay

## 2024-09-24 DIAGNOSIS — I861 Scrotal varices: Secondary | ICD-10-CM | POA: Diagnosis not present

## 2024-09-24 DIAGNOSIS — Z79899 Other long term (current) drug therapy: Secondary | ICD-10-CM | POA: Insufficient documentation

## 2024-09-24 DIAGNOSIS — N433 Hydrocele, unspecified: Secondary | ICD-10-CM | POA: Diagnosis not present

## 2024-09-24 DIAGNOSIS — I10 Essential (primary) hypertension: Secondary | ICD-10-CM | POA: Diagnosis not present

## 2024-09-24 DIAGNOSIS — N451 Epididymitis: Secondary | ICD-10-CM | POA: Insufficient documentation

## 2024-09-24 DIAGNOSIS — B349 Viral infection, unspecified: Secondary | ICD-10-CM | POA: Insufficient documentation

## 2024-09-24 DIAGNOSIS — N50811 Right testicular pain: Secondary | ICD-10-CM | POA: Diagnosis not present

## 2024-09-24 DIAGNOSIS — N5089 Other specified disorders of the male genital organs: Secondary | ICD-10-CM | POA: Diagnosis not present

## 2024-09-24 LAB — BASIC METABOLIC PANEL WITH GFR
Anion gap: 9 (ref 5–15)
BUN: 16 mg/dL (ref 8–23)
CO2: 29 mmol/L (ref 22–32)
Calcium: 9 mg/dL (ref 8.9–10.3)
Chloride: 103 mmol/L (ref 98–111)
Creatinine, Ser: 1.06 mg/dL (ref 0.61–1.24)
GFR, Estimated: >60 mL/min (ref >60)
Glucose, Bld: 121 mg/dL — ABNORMAL HIGH (ref 70–99)
Potassium: 3.4 mmol/L — ABNORMAL LOW (ref 3.5–5.1)
Sodium: 141 mmol/L (ref 135–145)

## 2024-09-24 LAB — CBC
HCT: 42 % (ref 39.0–52.0)
Hemoglobin: 14.3 g/dL (ref 13.0–17.0)
MCH: 29.5 pg (ref 26.0–34.0)
MCHC: 34 g/dL (ref 30.0–36.0)
MCV: 86.8 fL (ref 80.0–100.0)
Platelets: 212 K/uL (ref 150–400)
RBC: 4.84 MIL/uL (ref 4.22–5.81)
RDW: 12.6 % (ref 11.5–15.5)
WBC: 10.2 K/uL (ref 4.0–10.5)
nRBC: 0 % (ref 0.0–0.2)

## 2024-09-24 LAB — URINALYSIS, W/ REFLEX TO CULTURE (INFECTION SUSPECTED)
Bilirubin Urine: NEGATIVE
Glucose, UA: NEGATIVE mg/dL
Ketones, ur: NEGATIVE mg/dL
Nitrite: POSITIVE — AB
Protein, ur: 30 mg/dL — AB
Specific Gravity, Urine: 1.023 (ref 1.005–1.030)
WBC, UA: >50 WBC/hpf (ref 0–5)
pH: 5 (ref 5.0–8.0)

## 2024-09-24 MED ORDER — LEVOFLOXACIN 500 MG PO TABS: 500.0000 mg | ORAL_TABLET | Freq: Every day | ORAL | 0 refills | Status: DC

## 2024-09-24 MED ORDER — OXYCODONE-ACETAMINOPHEN 5-325 MG PO TABS: 1.0000 | ORAL_TABLET | Freq: Four times a day (QID) | ORAL | 0 refills | Status: DC | PRN

## 2024-09-24 MED ORDER — KETOROLAC TROMETHAMINE 15 MG/ML IJ SOLN
15.0000 mg | Freq: Once | INTRAMUSCULAR | Status: AC
  Administered 2024-09-24: 15 mg via INTRAVENOUS
  Filled 2024-09-24: qty 1

## 2024-09-24 NOTE — Telephone Encounter (Signed)
 Patient called, I attempted to send him to [facility], but the line never had anyone answer. Waitied on hold for over 5 mins. The patient hung up and called back. He decided to go somewhere that had ultrasound, because google told him that we would be glad to see him and he said that was ok, he would go to [facility] instead.

## 2024-09-24 NOTE — Telephone Encounter (Signed)
 Tried to call spouse in reference to appt time and complaint to send to nurse connect, no answer.

## 2024-09-24 NOTE — Telephone Encounter (Signed)
 Tried to call patient to send to [facility] due to complaint an there was NO Answer.

## 2024-09-24 NOTE — ED Triage Notes (Signed)
 Pov, c/o of right testicular pain that started this past Monday. Pt states last week was having on and off chills. Monday started the testicular pain. Pt took ibuprofen with relief. Pt states did some heavy lifting Tuesday and pain consistently gotten worse.

## 2024-09-24 NOTE — ED Provider Notes (Signed)
 [place] EMERGENCY DEPARTMENT AT [facility] Provider Note   CSN: [id number] Arrival date & time: 09/24/24  1015     Patient presents with: Testicle Pain (Right)   James Solis. is a 73 y.o. male.    Testicle Pain  Patient has right-sided testicle pain.  For around the last week had been dealing with some respiratory issues.  Coughing felt he had a cold or the flu.  States then began to have pain in his right testicle.  More swelling.  Flu type symptoms improve but not worsening testicular pain.  No dysuria.  Denies chance of STD.  Worse with certain positions.  No penile discharge.    Past Medical History:  Diagnosis Date   Cataract 10/27/20   Removed on right eye   Cough    Gout    Hyperlipidemia    Hypertension    Nasal congestion    Rash    bilateral legs    Sinusitis     Prior to Admission medications   Medication Sig Start Date End Date Taking? Authorizing Provider  levofloxacin (LEVAQUIN) 500 MG tablet Take 1 tablet (500 mg total) by mouth daily. 09/24/24  Yes Patsey Lot, MD  oxyCODONE-acetaminophen (PERCOCET/ROXICET) 5-325 MG tablet Take 1 tablet by mouth every 6 (six) hours as needed for severe pain (pain score 7-10). 09/24/24  Yes Patsey Lot, MD  amLODipine  (NORVASC ) 10 MG tablet Take 1 tablet (10 mg total) by mouth daily. 06/14/24   Lavell Bari LABOR, FNP  atorvastatin  (LIPITOR) 20 MG tablet Take 1 tablet (20 mg total) by mouth daily. 06/14/24   Lavell Bari LABOR, FNP  fluocinonide  ointment (LIDEX ) 0.05 % APPLY TO AFFECTED AREA TWICE A DAY 11/19/21   Hawks, [name] A, FNP  losartan  (COZAAR ) 100 MG tablet Take 1 tablet (100 mg total) by mouth daily. 06/14/24   Lavell Bari LABOR, FNP  metoprolol  succinate (TOPROL -XL) 50 MG 24 hr tablet TAKE 1 TABLET BY MOUTH DAILY WITH OR IMMEDIATELY FOLLOWING A MEAL. 06/14/24   Lavell Bari LABOR, FNP  tamsulosin  (FLOMAX ) 0.4 MG CAPS capsule Take 2 capsules (0.8 mg total) by mouth daily. 06/14/24   Lavell Bari  A, FNP  terazosin  (HYTRIN ) 10 MG capsule Take 1 capsule (10 mg total) by mouth at bedtime. 06/14/24   Lavell Bari LABOR, FNP    Allergies: Patient has no known allergies.    Review of Systems  Genitourinary:  Positive for testicular pain.    Updated Vital Signs BP (!) 160/85   Pulse 61   Temp 98.1 F (36.7 C) (Oral)   Resp 18   Ht 6' 2 (1.88 m)   Wt 131.5 kg   SpO2 97%   BMI 37.23 kg/m   Physical Exam Vitals and nursing note reviewed.  Cardiovascular:     Rate and Rhythm: Normal rate.  Abdominal:     Tenderness: There is no abdominal tenderness.  Genitourinary:    Comments: No tenderness to left testicle.  No right inguinal mass felt.  Does however have what appears to be enlarged and potentially horizontal lie of right testicle.  Skin normal. Neurological:     Mental Status: He is alert.     (all labs ordered are listed, but only abnormal results are displayed) Labs Reviewed  BASIC METABOLIC PANEL WITH GFR - Abnormal; Notable for the following components:      Result Value   Potassium 3.4 (*)    Glucose, Bld 121 (*)    All other components within  normal limits  URINALYSIS, W/ REFLEX TO CULTURE (INFECTION SUSPECTED) - Abnormal; Notable for the following components:   APPearance CLOUDY (*)    Hgb urine dipstick SMALL (*)    Protein, ur 30 (*)    Nitrite POSITIVE (*)    Leukocytes,Ua MODERATE (*)    Bacteria, UA RARE (*)    All other components within normal limits  URINE CULTURE  CBC  GC/CHLAMYDIA PROBE AMP ([place]) NOT AT [facility]    EKG: None  Radiology: US  SCROTUM W/DOPPLER Result Date: 09/24/2024 CLINICAL DATA:  Right testicular pain. EXAM: SCROTAL ULTRASOUND DOPPLER ULTRASOUND OF THE TESTICLES TECHNIQUE: Complete ultrasound examination of the testicles, epididymis, and other scrotal structures was performed. Color and spectral Doppler ultrasound were also utilized to evaluate blood flow to the testicles. COMPARISON:  None Available. FINDINGS: Right  testicle Measurements: 4.2 x 2.3 x 2.5 cm. No mass or microlithiasis visualized. Left testicle Measurements: 4.8 x 2.5 x 3.6 cm. No mass or microlithiasis visualized. Right epididymis: Asymmetric enlargement and increased blood flow on color Doppler ultrasound, consistent with epididymitis. Small epididymal cysts or spermatoceles seen, largest measuring 1.3 cm. Left epididymis:  7 mm cyst or spermatocele in the epididymal head Hydrocele: Large complex right hydrocele containing multiple thin internal septations. Small left hydrocele containing a 5 mm calcification consistent with a scrotal pearl. Varicocele:  None visualized. Pulsed Doppler interrogation of both testes demonstrates normal low resistance arterial and venous waveforms bilaterally. IMPRESSION: No evidence of testicular mass or torsion. Right-sided epididymitis. Large complex right hydrocele, and small left hydrocele. Small bilateral epididymal cysts or spermatoceles. Electronically Signed   By: Norleen DELENA Kil M.D.   On: 09/24/2024 13:06     Procedures   Medications Ordered in the ED  ketorolac (TORADOL) 15 MG/ML injection 15 mg (15 mg Intravenous Given 09/24/24 1107)                                    Medical Decision Making Amount and/or Complexity of Data Reviewed Labs: ordered. Radiology: ordered.  Risk Prescription drug management.   Patient right testicle pain.  Began after potential viral syndrome.  Differential diagnose does include viral orchitis but also other causes such as torsion.  Will get symptomatic treatment for pain and get ultrasound.  Ultrasound shows hydrocele.  Also epididymitis.  Will treat for enteric organisms.  Pain medicines given.  Follow-up with urology as needed.  Appears stable for discharge home however.  No torsion.      Final diagnoses:  Epididymitis  Varicocele    ED Discharge Orders          Ordered    oxyCODONE-acetaminophen (PERCOCET/ROXICET) 5-325 MG tablet  Every 6 hours PRN         09/24/24 1353    levofloxacin (LEVAQUIN) 500 MG tablet  Daily        09/24/24 1353               Patsey Lot, MD 09/24/24 1513

## 2024-09-26 ENCOUNTER — Ambulatory Visit: Admitting: Family

## 2024-09-26 LAB — URINE CULTURE: Culture: >=100000 — AB

## 2024-09-26 LAB — GC/CHLAMYDIA PROBE AMP (~~LOC~~) NOT AT ARMC
Chlamydia: NEGATIVE
Comment: NEGATIVE
Comment: NORMAL
Neisseria Gonorrhea: NEGATIVE

## 2024-09-27 ENCOUNTER — Telehealth (HOSPITAL_BASED_OUTPATIENT_CLINIC_OR_DEPARTMENT_OTHER): Payer: Self-pay | Admitting: *Deleted

## 2024-09-27 NOTE — Telephone Encounter (Signed)
 Post ED Visit - Positive Culture Follow-up  Culture report reviewed by antimicrobial stewardship pharmacist: [name] [x]  [name], Pharm.D. []  [name], Pharm.D., [name] AQ-ID []  [name], Pharm.D., [name] []  [name], Pharm.D., [name] []  [name], [name], [name], [name] []  [name], Pharm.D., [name], [name] []  [name], PharmD, [name] []  [name], PharmD, [name] []  [name], PharmD, [name] []  [name], PharmD []  [name], PharmD, [name] []  [name], PharmD  [name] []  [name], PharmD []  [name], PharmD []  [name], PharmD []  [name], Rph []  [name], PharmD []  [name], PharmD []  [name], PharmD []  [name], PharmD []  [name], PharmD []  [name], PharmD []  [name], PharmD []  [name], PharmD []  [name], PharmD   Positive urine culture Treated with levofloxacin, organism sensitive to the same and no further patient follow-up is required at this time.  James Solis 09/27/2024, 10:17 AM

## 2024-09-29 ENCOUNTER — Ambulatory Visit: Admitting: Family Medicine

## 2024-09-29 ENCOUNTER — Ambulatory Visit: Payer: Self-pay

## 2024-09-29 ENCOUNTER — Encounter: Payer: Self-pay | Admitting: Family Medicine

## 2024-09-29 VITALS — BP 161/82 | HR 65 | Temp 98.2°F | Ht 74.0 in | Wt 290.0 lb

## 2024-09-29 DIAGNOSIS — N453 Epididymo-orchitis: Secondary | ICD-10-CM

## 2024-09-29 DIAGNOSIS — Z23 Encounter for immunization: Secondary | ICD-10-CM

## 2024-09-29 MED ORDER — LEVOFLOXACIN 500 MG PO TABS: 500.0000 mg | ORAL_TABLET | Freq: Every day | ORAL | 0 refills | Status: DC

## 2024-09-29 NOTE — Progress Notes (Signed)
 Subjective:  Patient ID: James Solis., male    DOB: [birth year]  Age: 73 y.o. MRN: [id number]  CC: Groin Swelling (Started 2 weeks ago. Last Saturday pt was having a lot of pain so he went to [facility]. Abx given and it will run out Saturday. Pain is less but the swelling has not gone down. Pt report purple. Bilateral but right side is hard and more swollen. )   HPI  Discussed the use of AI scribe software for clinical note transcription with the patient, who gave verbal consent to proceed.  History of Present Illness James Solis. [name] is a 73 year old male who presents with swelling in his testicles.  He has experienced swelling in his testicles for the past two weeks. Initially, he sought care at an emergency department and was prescribed levofloxacin, which he started on November 29th. While the medication has alleviated his pain, the swelling persists, causing concern as his antibiotic course is nearing completion.  The swelling is bilateral, with more significant involvement on the right side. He reports that urination is more challenging due to the swelling. An ultrasound performed on November 29th showed no evidence of testicular mass or torsion, but did note a large complex right hydrocele, a small left hydrocele, and epididymal cysts on both sides.  He prefers to use acetaminophen as his primary pain management option. He is proactive about monitoring the swelling and pain, noting that the pain has decreased recently.  No difficulty with urination aside from the challenges posed by the swelling. No significant change in the size of the swelling but a reduction in pain. He is in fairly good health and is proactive about maintaining his health, with no history of cancer and a desire to remain healthy.          09/05/2024    8:50 AM 06/14/2024    8:42 AM 12/18/2023    8:47 AM  Depression screen PHQ 2/9  Decreased Interest 0 0 0  Down, Depressed, Hopeless 0 0 0  PHQ - 2  Score 0 0 0  Altered sleeping  0 0  Tired, decreased energy  0 0  Change in appetite  1 0  Feeling bad or failure about yourself   0 0  Trouble concentrating  0 0  Moving slowly or fidgety/restless  0 0  Suicidal thoughts  0 0  PHQ-9 Score  1  0   Difficult doing work/chores  Not difficult at all Not difficult at all     Data saved with a previous flowsheet row definition    History James Solis has a past medical history of Cataract (10/27/20), Cough, Gout, Hyperlipidemia, Hypertension, Nasal congestion, Rash, and Sinusitis.   He has a past surgical history that includes Inguinal hernia repair (10/28/1995); Colonoscopy (10/27/2004); Hernia repair (2006); and Eye surgery (Jan.2022).   His family history includes Asthma in his mother; Coronary artery disease in his father; Hypertension in an other family member.He reports that he has never smoked. He has never used smokeless tobacco. He reports that he does not drink alcohol and does not use drugs.    ROS Review of Systems  Objective:  BP (!) 161/82   Pulse 65   Temp 98.2 F (36.8 C)   Ht 6' 2 (1.88 m)   Wt 290 lb (131.5 kg)   SpO2 97%   BMI 37.23 kg/m   BP Readings from Last 3 Encounters:  09/29/24 (!) 161/82  09/24/24 (!) 160/85  09/05/24  132/81    Wt Readings from Last 3 Encounters:  09/29/24 290 lb (131.5 kg)  09/24/24 290 lb (131.5 kg)  09/05/24 287 lb (130.2 kg)     Physical Exam Physical Exam GENERAL: Alert, cooperative, well developed, no acute distress. HEENT: Normocephalic, normal oropharynx, moist mucous membranes. CHEST: Clear to auscultation bilaterally. No wheezes, rhonchi, or crackles. CARDIOVASCULAR: Normal heart rate and rhythm. S1 and S2 normal without murmurs. ABDOMEN: Soft, non-tender, non-distended, without organomegaly. Normal bowel sounds. GENITOURINARY: Scrotum swollen with fluid leakage through skin port. Penis retracted due to swelling. EXTREMITIES: No cyanosis or edema. NEUROLOGICAL:  Cranial nerves grossly intact. Moves all extremities without gross motor or sensory deficit.   Assessment & Plan:  Epididymoorchitis  Encounter for immunization -     Flu vaccine HIGH DOSE PF(Fluzone Trivalent)  Other orders -     levoFLOXacin; Take 1 tablet (500 mg total) by mouth daily.  Dispense: 10 tablet; Refill: 0    Assessment and Plan Assessment & Plan Right epididymitis with bilateral hydroceles and bilateral epididymal cysts   Ultrasound confirmed right epididymitis with no testicular mass or torsion. There is a large complex right hydrocele, a small left hydrocele, and bilateral epididymal cysts. Swelling persists despite initial levofloxacin treatment, though pain has improved. The hydrocele may need urological intervention if it does not resolve with infection treatment. Testicular torsion was ruled out, preventing testicular necrosis. Renew levofloxacin for additional days. Use ice packs for 15-20 minutes to reduce swelling. Wear supportive undershorts for comfort. Take acetaminophen for pain, with ibuprofen as a secondary option. Consider urologist referral if hydrocele does not improve in a week.       Follow-up: Return if symptoms worsen or fail to improve.  Butler Der, M.D.

## 2024-09-29 NOTE — Telephone Encounter (Signed)
 FYI Only or Action Required?: FYI only for provider: appointment scheduled on 09/29/2024.  Patient was last seen in primary care on 06/14/2024 by Lavell Bari LABOR, FNP.  Called Nurse Triage reporting Groin Swelling.  Symptoms began Saturday.  Interventions attempted: Prescription medications: ABX.  Symptoms are: unchanged.  Triage Disposition: See PCP When Office is Open (Within 3 Days)  Patient/caregiver understands and will follow disposition?: Yes   Copied from CRM #[id number]. Topic: Clinical - Red Word Triage >> Sep 29, 2024  9:08 AM Alfonso ORN wrote: Red Word that prompted transfer to Nurse Triage:  patient was in  ER last week for Epididymitis  was given the medication levofloxacin (LEVAQUIN) 500 MG tablet  still have swelling in Testicle , still have pain rate 7 Reason for Disposition  [1] Swelling goes away when you push on it AND [2] no pain  Answer Assessment - Initial Assessment Questions 1. SCROTAL SWELLING: What does the scrotum look like? How swollen is it? (mild, moderate severe; compare to other side)     moderate 2. LOCATION: Where is the swelling located?     testicles 3. ONSET: When did the swelling start?     Saturday 4. PATTERN: Does it come and go, or has it been constant since it started?     constant 5. SCROTAL PAIN: Is there any pain? If Yes, ask: How bad is it?  (Scale 1-10; or mild, moderate, severe)     moderate 6. HERNIA: Has a doctor ever told you that you have a hernia?     na 7. OTHER SYMPTOMS: Do you have any other symptoms? (e.g., abdomen pain, difficulty passing urine, fever, vomiting)     Redness, tender to touch, swelling has not gone down  ABX run out on Saturday: pt states redness and swelling still looks the same: no fever at present time.  Pt scheduling appt for re-evaluation of current status and update on plan of care.  Protocols used: Scrotum Swelling-A-AH

## 2024-09-29 NOTE — Telephone Encounter (Signed)
 Patient is scheduled for ER follow up today with Dr Zollie. PCP out of office until Monday.

## 2024-10-21 ENCOUNTER — Telehealth: Admitting: Family Medicine

## 2024-10-21 DIAGNOSIS — J111 Influenza due to unidentified influenza virus with other respiratory manifestations: Secondary | ICD-10-CM | POA: Diagnosis not present

## 2024-10-21 MED ORDER — OSELTAMIVIR PHOSPHATE 75 MG PO CAPS: 75.0000 mg | ORAL_CAPSULE | Freq: Two times a day (BID) | ORAL | 0 refills | Status: AC

## 2024-10-21 NOTE — Progress Notes (Signed)
 E visit for Flu like symptoms   We are sorry that you are not feeling well.  Here is how we plan to help! Based on what you have shared with me it looks like you may have a respiratory virus that may be influenza.  Influenza or "the flu" is  an infection caused by a respiratory virus. The flu virus is highly contagious and persons who did not receive their yearly flu vaccination may "catch" the flu from close contact.  We have anti-viral medications to treat the viruses that cause this infection. They are not a "cure" and only shorten the course of the infection. These prescriptions are most effective when they are given within the first 2 days of "flu" symptoms. Antiviral medications are indicated if you have a high risk of complications from the flu. You should  also consider an antiviral medication if you are in close contact with someone who is at risk. These medications can help patients avoid complications from the flu but have side effects that you should know.   Possible side effects from Tamiflu  or oseltamivir  include nausea, vomiting, diarrhea, dizziness, headaches, eye redness, sleep problems or other respiratory symptoms. You should not take Tamiflu  if you have an allergy to oseltamivir  or any to the ingredients in Tamiflu .  Based upon your symptoms and potential risk factors I have prescribed Oseltamivir  (Tamiflu ).  It has been sent to your designated pharmacy.  You will take one 75 mg capsule orally twice a day for the next 5 days.   For nasal congestion, you may use an oral decongestant such as Mucinex D or if you have glaucoma or high blood pressure use plain Mucinex.  Saline nasal spray or nasal drops can help and can safely be used as often as needed for congestion.  If you have a sore or scratchy throat, use a saltwater gargle-  to  teaspoon of salt dissolved in a 4-ounce to 8-ounce glass of warm water.  Gargle the solution for approximately 15-30 seconds and then spit.  It is  important not to swallow the solution.  You can also use throat lozenges/cough drops and Chloraseptic spray to help with throat pain or discomfort.  Warm or cold liquids can also be helpful in relieving throat pain.  For headache, pain or general discomfort, you can use Ibuprofen or Tylenol as directed.   Some authorities believe that zinc sprays or the use of Echinacea may shorten the course of your symptoms. You are to isolate at home until you have been fever-free for at least 24 hours without a fever-reducing medication, and symptoms have been steadily improving for 24 hours.  If you must be around other household members who do not have symptoms, you need to make sure that both you and the family members are masking consistently with a high-quality mask.  If you note any worsening of symptoms despite treatment, please seek an in-person evaluation ASAP. If you note any significant shortness of breath or any chest pain, please seek ED evaluation. Please do not delay care!  ANYONE WHO HAS FLU SYMPTOMS SHOULD: Stay home. The flu is highly contagious and going out or to work exposes others! Be sure to drink plenty of fluids. Water is fine as well as fruit juices, sodas and electrolyte beverages. You may want to stay away from caffeine or alcohol. If you are nauseated, try taking small sips of liquids. How do you know if you are getting enough fluid? Your urine should be a pale  yellow or almost colorless. Get rest. Taking a steamy shower or using a humidifier may help nasal congestion and ease sore throat pain. Using a saline nasal spray works much the same way. Cough drops, hard candies and sore throat lozenges may ease your cough. Line up a caregiver. Have someone check on you regularly.  GET HELP RIGHT AWAY IF: You cannot keep down liquids or your medications. You become short of breath Your fell like you are going to pass out or loose consciousness. Your symptoms persist after you have  completed your treatment plan  MAKE SURE YOU  Understand these instructions. Will watch your condition. Will get help right away if you are not doing well or get worse.  Your e-visit answers were reviewed by a board certified advanced clinical practitioner to complete your personal care plan.  Depending on the condition, your plan could have included both over the counter or prescription medications.  If there is a problem please reply  once you have received a response from your provider.  Your safety is important to us .  If you have drug allergies check your prescription carefully.    You can use MyChart to ask questions about today's visit, request a non-urgent call back, or ask for a work or school excuse for 24 hours related to this e-Visit. If it has been greater than 24 hours you will need to follow up with your provider, or enter a new e-Visit to address those concerns.  You will get an e-mail in the next two days asking about your experience.  I hope that your e-visit has been valuable and will speed your recovery. Thank you for using e-visits.   I have spent 5 minutes in review of e-visit questionnaire, review and updating patient chart, medical decision making and response to patient.   Karmina Zufall, FNP

## 2024-10-26 ENCOUNTER — Other Ambulatory Visit: Payer: Self-pay | Admitting: Family

## 2024-10-26 DIAGNOSIS — I10 Essential (primary) hypertension: Secondary | ICD-10-CM

## 2024-11-18 ENCOUNTER — Encounter: Payer: Self-pay | Admitting: Family

## 2024-11-18 ENCOUNTER — Ambulatory Visit: Admitting: Family

## 2024-11-18 VITALS — BP 155/78 | HR 59 | Temp 97.9°F | Ht 74.0 in | Wt 296.6 lb

## 2024-11-18 DIAGNOSIS — Z713 Dietary counseling and surveillance: Secondary | ICD-10-CM

## 2024-11-18 DIAGNOSIS — I10 Essential (primary) hypertension: Secondary | ICD-10-CM

## 2024-11-18 DIAGNOSIS — F40240 Claustrophobia: Secondary | ICD-10-CM | POA: Diagnosis not present

## 2024-11-18 DIAGNOSIS — E785 Hyperlipidemia, unspecified: Secondary | ICD-10-CM | POA: Diagnosis not present

## 2024-11-18 MED ORDER — WEGOVY 1.5 MG PO TABS: 1.5000 mg | ORAL_TABLET | Freq: Every day | ORAL | 1 refills | Status: DC

## 2024-11-18 NOTE — Patient Instructions (Signed)

## 2024-11-18 NOTE — Progress Notes (Signed)
 "  Subjective:    Patient ID: James Solis., male    DOB: 1951/08/15, 74 y.o.   MRN: [id number]  Chief Complaint  Patient presents with   Obesity    Wants to go on oral wegovy     PT presents to the office today for chronic follow up.    He is morbid obese with BMI of 38 with HTN and hyperlipidemia.   He was retired, but has went back to work.   Complaining of feeling claustrophobic and driving over heights. He states this has started disrupting his life because he will look at maps before driving and avoid areas.  Hypertension This is a chronic problem. The current episode started more than 1 year ago. The problem has been waxing and waning since onset. The problem is uncontrolled. Associated symptoms include malaise/fatigue. Pertinent negatives include no orthopnea, peripheral edema or shortness of breath. Risk factors for coronary artery disease include obesity and male gender. The current treatment provides moderate improvement.  Hyperlipidemia This is a chronic problem. The current episode started more than 1 year ago. The problem is controlled. Recent lipid tests were reviewed and are normal. Exacerbating diseases include obesity. Pertinent negatives include no shortness of breath. Current antihyperlipidemic treatment includes statins. The current treatment provides moderate improvement of lipids. Risk factors for coronary artery disease include dyslipidemia, hypertension, a sedentary lifestyle, obesity and male sex.  Benign Prostatic Hypertrophy This is a chronic problem. The current episode started more than 1 year ago. Irritative symptoms include nocturia (2-3). Past treatments include tamsulosin  and terazosin . The treatment provided moderate relief.      Review of Systems  Constitutional:  Positive for malaise/fatigue.  Respiratory:  Negative for shortness of breath.   Cardiovascular:  Negative for orthopnea.  Genitourinary:  Positive for nocturia (2-3).  All other systems  reviewed and are negative.  Family History  Problem Relation Age of Onset   Asthma Mother    Coronary artery disease Father        x3 Bypasses    Hypertension Other    Colon cancer Neg Hx    Social History   Socioeconomic History   Marital status: Married    Spouse name: Not on file   Number of children: Not on file   Years of education: Not on file   Highest education level: Associate degree: occupational, scientist, product/process development, or vocational program  Occupational History   Not on file  Tobacco Use   Smoking status: Never   Smokeless tobacco: Never  Vaping Use   Vaping status: Never Used  Substance and Sexual Activity   Alcohol use: No    Alcohol/week: 0.0 standard drinks of alcohol   Drug use: No   Sexual activity: Not on file  Other Topics Concern   Not on file  Social History Narrative   Not on file   Social Drivers of Health   Tobacco Use: Low Risk (11/18/2024)   Patient History    Smoking Tobacco Use: Never    Smokeless Tobacco Use: Never    Passive Exposure: Not on file  Financial Resource Strain: Low Risk (09/23/2024)   Overall Financial Resource Strain (CARDIA)    Difficulty of Paying Living Expenses: Not hard at all  Food Insecurity: No Food Insecurity (09/23/2024)   Epic    Worried About Radiation Protection Practitioner of Food in the Last Year: Never true    Ran Out of Food in the Last Year: Never true  Transportation Needs: No Transportation Needs (09/23/2024)  Epic    Lack of Transportation (Medical): No    Lack of Transportation (Non-Medical): No  Physical Activity: Inactive (09/23/2024)   Exercise Vital Sign    Days of Exercise per Week: 0 days    Minutes of Exercise per Session: Not on file  Stress: No Stress Concern Present (09/23/2024)   [employer] of Occupational Health - Occupational Stress Questionnaire    Feeling of Stress: Not at all  Social Connections: Moderately Integrated (09/23/2024)   Social Connection and Isolation Panel    Frequency of Communication  with Friends and Family: Three times a week    Frequency of Social Gatherings with Friends and Family: Twice a week    Attends Religious Services: 1 to 4 times per year    Active Member of Clubs or Organizations: No    Attends Engineer, Structural: Not on file    Marital Status: Married  Depression (PHQ2-9): Low Risk (11/18/2024)   Depression (PHQ2-9)    PHQ-2 Score: 0  Alcohol Screen: Low Risk (09/23/2024)   Alcohol Screen    Last Alcohol Screening Score (AUDIT): 1  Housing: Low Risk (09/23/2024)   Epic    Unable to Pay for Housing in the Last Year: No    Number of Times Moved in the Last Year: 0    Homeless in the Last Year: No  Utilities: Not At Risk (09/05/2024)   Epic    Threatened with loss of utilities: No  Health Literacy: Adequate Health Literacy (09/05/2024)   B1300 Health Literacy    Frequency of need for help with medical instructions: Never       Objective:   Physical Exam Vitals reviewed.  Constitutional:      General: He is not in acute distress.    Appearance: He is well-developed. He is obese.  HENT:     Head: Normocephalic.     Right Ear: Tympanic membrane normal.     Left Ear: Tympanic membrane normal.  Eyes:     General:        Right eye: No discharge.        Left eye: No discharge.     Pupils: Pupils are equal, round, and reactive to light.  Neck:     Thyroid: No thyromegaly.  Cardiovascular:     Rate and Rhythm: Normal rate and regular rhythm.     Heart sounds: Normal heart sounds. No murmur heard. Pulmonary:     Effort: Pulmonary effort is normal. No respiratory distress.     Breath sounds: Normal breath sounds. No wheezing.  Abdominal:     General: Bowel sounds are normal. There is no distension.     Palpations: Abdomen is soft.     Tenderness: There is no abdominal tenderness.  Musculoskeletal:        General: No tenderness. Normal range of motion.     Cervical back: Normal range of motion and neck supple.  Skin:    General:  Skin is warm and dry.     Findings: No erythema or rash.  Neurological:     Mental Status: He is alert and oriented to person, place, and time.     Cranial Nerves: No cranial nerve deficit.     Deep Tendon Reflexes: Reflexes are normal and symmetric.  Psychiatric:        Behavior: Behavior normal.        Thought Content: Thought content normal.        Judgment: Judgment normal.  BP (!) 155/78   Pulse (!) 59   Temp 97.9 F (36.6 C) (Temporal)   Ht 6' 2 (1.88 m)   Wt 296 lb 9.6 oz (134.5 kg)   BMI 38.08 kg/m      Assessment & Plan:  Tan Clopper. comes in today with chief complaint of Obesity (Wants to go on oral wegovy  )   Diagnosis and orders addressed:  1. Primary hypertension (Primary) Start Wegovy  1.5 mg  Encourage healthy diet and exercise  If insurance does not cover medication, can use GoodRx Follow up in 1 month - semaglutide -weight management (WEGOVY ) 1.5 MG tablet; Take 1 tablet (1.5 mg total) by mouth daily. Daily in AM on an empty stomach with 4 oz of water. Do not eat or drink for 30 minutes after dose.  Dispense: 30 tablet; Refill: 1 - CMP14+EGFR  2. Obesity, morbid (HCC) - semaglutide -weight management (WEGOVY ) 1.5 MG tablet; Take 1 tablet (1.5 mg total) by mouth daily. Daily in AM on an empty stomach with 4 oz of water. Do not eat or drink for 30 minutes after dose.  Dispense: 30 tablet; Refill: 1 - CMP14+EGFR  3. Hyperlipidemia, unspecified hyperlipidemia type - semaglutide -weight management (WEGOVY ) 1.5 MG tablet; Take 1 tablet (1.5 mg total) by mouth daily. Daily in AM on an empty stomach with 4 oz of water. Do not eat or drink for 30 minutes after dose.  Dispense: 30 tablet; Refill: 1 - CMP14+EGFR  4. Claustrophobia - CMP14+EGFR  5. Weight loss counseling, encounter for - semaglutide -weight management (WEGOVY ) 1.5 MG tablet; Take 1 tablet (1.5 mg total) by mouth daily. Daily in AM on an empty stomach with 4 oz of water. Do not eat or  drink for 30 minutes after dose.  Dispense: 30 tablet; Refill: 1 - CMP14+EGFR   Labs pending Continue current medications  Start Wegovy  1.5 mg daily Encourage healthy diet and exercise  Health Maintenance reviewed Diet and exercise encouraged  Follow up plan: 1 month for weight loss   Bari Learn, FNP   "

## 2024-11-19 LAB — CMP14+EGFR
ALT: 33 [IU]/L (ref 0–44)
AST: 28 [IU]/L (ref 0–40)
Albumin: 4.4 g/dL (ref 3.8–4.8)
Alkaline Phosphatase: 75 [IU]/L (ref 47–123)
BUN/Creatinine Ratio: 13 (ref 10–24)
BUN: 15 mg/dL (ref 8–27)
Bilirubin Total: 1 mg/dL (ref 0.0–1.2)
CO2: 26 mmol/L (ref 20–29)
Calcium: 9.2 mg/dL (ref 8.6–10.2)
Chloride: 104 mmol/L (ref 96–106)
Creatinine, Ser: 1.12 mg/dL (ref 0.76–1.27)
Globulin, Total: 1.9 g/dL (ref 1.5–4.5)
Glucose: 85 mg/dL (ref 70–99)
Potassium: 3.7 mmol/L (ref 3.5–5.2)
Sodium: 144 mmol/L (ref 134–144)
Total Protein: 6.3 g/dL (ref 6.0–8.5)
eGFR: 70 mL/min/{1.73_m2}

## 2024-11-24 ENCOUNTER — Telehealth: Payer: Self-pay

## 2024-11-24 ENCOUNTER — Ambulatory Visit: Payer: Self-pay | Admitting: Family

## 2024-11-24 NOTE — Telephone Encounter (Signed)
 Tell him to look medication on Goodrx or he can text SAVE to [phone] for coupon.

## 2024-11-24 NOTE — Telephone Encounter (Signed)
 FYI for PCP.

## 2024-11-24 NOTE — Telephone Encounter (Signed)
Nurse to call patient.

## 2024-11-24 NOTE — Telephone Encounter (Signed)
 Copied from CRM #[id number]. Topic: Clinical - Prescription Issue >> Nov 24, 2024 10:25 AM Alfonso ORN wrote: Reason for CRM:   was seen 2 weeks by Hawks,[name] to discuss getting on the weygovy  patient is overweight mainly requesting  and on 4 different blood pressure medication  [facility] stated the cost of the [facility] is $1500 and would better to send as an injectiable instead of a pill  form  And if can  submit as patient having BMI 37 and then have severe hypertension

## 2024-11-25 DIAGNOSIS — I10 Essential (primary) hypertension: Secondary | ICD-10-CM

## 2024-11-25 DIAGNOSIS — Z713 Dietary counseling and surveillance: Secondary | ICD-10-CM

## 2024-11-25 DIAGNOSIS — E785 Hyperlipidemia, unspecified: Secondary | ICD-10-CM

## 2024-11-25 NOTE — Telephone Encounter (Signed)
 Patiebt aware and states [facility] is just giving him a hard time advised to call his insurance company to see what they say

## 2024-11-27 MED ORDER — WEGOVY 1.5 MG PO TABS: 1.5000 mg | ORAL_TABLET | Freq: Every day | ORAL | 1 refills | Status: AC

## 2024-12-23 ENCOUNTER — Ambulatory Visit: Admitting: Family

## 2025-09-06 ENCOUNTER — Ambulatory Visit

## 2025-09-11 ENCOUNTER — Ambulatory Visit
